# Patient Record
Sex: Female | Born: 1935 | Race: White | Hispanic: No | Smoking: Former smoker
Health system: Southern US, Community
[De-identification: ages and names within clinical notes are randomized; demographics above are authoritative.]

## PROBLEM LIST (undated history)

## (undated) DIAGNOSIS — I4891 Unspecified atrial fibrillation: Secondary | ICD-10-CM

## (undated) DIAGNOSIS — N3946 Mixed incontinence: Secondary | ICD-10-CM

## (undated) DIAGNOSIS — R55 Syncope and collapse: Secondary | ICD-10-CM

## (undated) DIAGNOSIS — I10 Essential (primary) hypertension: Secondary | ICD-10-CM

## (undated) DIAGNOSIS — E876 Hypokalemia: Secondary | ICD-10-CM

## (undated) DIAGNOSIS — F418 Other specified anxiety disorders: Secondary | ICD-10-CM

## (undated) DIAGNOSIS — R911 Solitary pulmonary nodule: Secondary | ICD-10-CM

## (undated) DIAGNOSIS — F0390 Unspecified dementia without behavioral disturbance: Secondary | ICD-10-CM

## (undated) DIAGNOSIS — K632 Fistula of intestine: Secondary | ICD-10-CM

## (undated) DIAGNOSIS — N6310 Unspecified lump in the right breast, unspecified quadrant: Secondary | ICD-10-CM

## (undated) DIAGNOSIS — E538 Deficiency of other specified B group vitamins: Secondary | ICD-10-CM

## (undated) DIAGNOSIS — K219 Gastro-esophageal reflux disease without esophagitis: Secondary | ICD-10-CM

## (undated) DIAGNOSIS — K589 Irritable bowel syndrome without diarrhea: Secondary | ICD-10-CM

## (undated) DIAGNOSIS — M1990 Unspecified osteoarthritis, unspecified site: Secondary | ICD-10-CM

## (undated) DIAGNOSIS — H3530 Unspecified macular degeneration: Secondary | ICD-10-CM

## (undated) HISTORY — PX: INSERTION OF MESH: SHX5868

## (undated) HISTORY — DX: Essential (primary) hypertension: I10

## (undated) HISTORY — DX: Mixed incontinence: N39.46

## (undated) HISTORY — DX: Solitary pulmonary nodule: R91.1

## (undated) HISTORY — DX: Gastro-esophageal reflux disease without esophagitis: K21.9

## (undated) HISTORY — DX: Unspecified macular degeneration: H35.30

## (undated) HISTORY — PX: CARDIAC CATHETERIZATION: SHX172

## (undated) HISTORY — PX: ILEOSTOMY CLOSURE: SHX1784

## (undated) HISTORY — DX: Unspecified osteoarthritis, unspecified site: M19.90

## (undated) HISTORY — DX: Fistula of intestine: K63.2

## (undated) HISTORY — DX: Other specified anxiety disorders: F41.8

## (undated) HISTORY — DX: Unspecified dementia, unspecified severity, without behavioral disturbance, psychotic disturbance, mood disturbance, and anxiety: F03.90

## (undated) HISTORY — DX: Unspecified atrial fibrillation: I48.91

## (undated) HISTORY — DX: Deficiency of other specified B group vitamins: E53.8

## (undated) HISTORY — DX: Syncope and collapse: R55

## (undated) HISTORY — DX: Hypokalemia: E87.6

## (undated) HISTORY — PX: ABDOMINAL HYSTERECTOMY: SHX81

## (undated) HISTORY — PX: OTHER SURGICAL HISTORY: SHX169

## (undated) HISTORY — PX: COLOSTOMY CLOSURE: SHX1381

## (undated) HISTORY — DX: Irritable bowel syndrome, unspecified: K58.9

## (undated) HISTORY — DX: Unspecified lump in the right breast, unspecified quadrant: N63.10

---

## 1988-09-11 HISTORY — PX: OTHER SURGICAL HISTORY: SHX169

## 2000-12-27 ENCOUNTER — Encounter: Payer: Self-pay | Admitting: *Deleted

## 2000-12-27 ENCOUNTER — Inpatient Hospital Stay (HOSPITAL_COMMUNITY): Admission: EM | Admit: 2000-12-27 | Discharge: 2000-12-30 | Payer: Self-pay | Admitting: *Deleted

## 2000-12-28 ENCOUNTER — Encounter (INDEPENDENT_AMBULATORY_CARE_PROVIDER_SITE_OTHER): Payer: Self-pay | Admitting: Internal Medicine

## 2001-01-01 ENCOUNTER — Encounter (INDEPENDENT_AMBULATORY_CARE_PROVIDER_SITE_OTHER): Payer: Self-pay | Admitting: Internal Medicine

## 2001-01-01 ENCOUNTER — Ambulatory Visit (HOSPITAL_COMMUNITY): Admission: RE | Admit: 2001-01-01 | Discharge: 2001-01-01 | Payer: Self-pay | Admitting: Internal Medicine

## 2001-06-06 ENCOUNTER — Ambulatory Visit (HOSPITAL_COMMUNITY): Admission: RE | Admit: 2001-06-06 | Discharge: 2001-06-06 | Payer: Self-pay | Admitting: Internal Medicine

## 2001-06-06 ENCOUNTER — Encounter (INDEPENDENT_AMBULATORY_CARE_PROVIDER_SITE_OTHER): Payer: Self-pay | Admitting: Internal Medicine

## 2001-08-24 ENCOUNTER — Emergency Department (HOSPITAL_COMMUNITY): Admission: EM | Admit: 2001-08-24 | Discharge: 2001-08-24 | Payer: Self-pay | Admitting: *Deleted

## 2001-08-24 ENCOUNTER — Encounter: Payer: Self-pay | Admitting: *Deleted

## 2002-05-16 ENCOUNTER — Ambulatory Visit (HOSPITAL_COMMUNITY): Admission: RE | Admit: 2002-05-16 | Discharge: 2002-05-16 | Payer: Self-pay | Admitting: General Surgery

## 2002-06-12 ENCOUNTER — Encounter: Payer: Self-pay | Admitting: Ophthalmology

## 2002-06-12 ENCOUNTER — Ambulatory Visit (HOSPITAL_COMMUNITY): Admission: RE | Admit: 2002-06-12 | Discharge: 2002-06-12 | Payer: Self-pay | Admitting: Ophthalmology

## 2002-07-17 ENCOUNTER — Encounter (INDEPENDENT_AMBULATORY_CARE_PROVIDER_SITE_OTHER): Payer: Self-pay | Admitting: Internal Medicine

## 2002-07-17 ENCOUNTER — Ambulatory Visit (HOSPITAL_COMMUNITY): Admission: RE | Admit: 2002-07-17 | Discharge: 2002-07-17 | Payer: Self-pay | Admitting: Internal Medicine

## 2002-09-15 ENCOUNTER — Ambulatory Visit (HOSPITAL_COMMUNITY): Admission: RE | Admit: 2002-09-15 | Discharge: 2002-09-15 | Payer: Self-pay | Admitting: Ophthalmology

## 2002-10-14 ENCOUNTER — Inpatient Hospital Stay (HOSPITAL_COMMUNITY): Admission: RE | Admit: 2002-10-14 | Discharge: 2002-10-20 | Payer: Self-pay | Admitting: General Surgery

## 2002-10-16 ENCOUNTER — Encounter: Payer: Self-pay | Admitting: *Deleted

## 2002-11-24 ENCOUNTER — Ambulatory Visit (HOSPITAL_COMMUNITY): Admission: RE | Admit: 2002-11-24 | Discharge: 2002-11-24 | Payer: Self-pay | Admitting: Internal Medicine

## 2002-11-24 ENCOUNTER — Encounter (INDEPENDENT_AMBULATORY_CARE_PROVIDER_SITE_OTHER): Payer: Self-pay | Admitting: Internal Medicine

## 2003-01-05 ENCOUNTER — Ambulatory Visit (HOSPITAL_COMMUNITY): Admission: RE | Admit: 2003-01-05 | Discharge: 2003-01-05 | Payer: Self-pay | Admitting: General Surgery

## 2003-01-05 ENCOUNTER — Encounter: Payer: Self-pay | Admitting: General Surgery

## 2003-11-06 ENCOUNTER — Inpatient Hospital Stay (HOSPITAL_COMMUNITY): Admission: RE | Admit: 2003-11-06 | Discharge: 2003-11-09 | Payer: Self-pay | Admitting: General Surgery

## 2004-01-19 ENCOUNTER — Ambulatory Visit (HOSPITAL_COMMUNITY): Admission: RE | Admit: 2004-01-19 | Discharge: 2004-01-19 | Payer: Self-pay | Admitting: Oncology

## 2004-02-19 ENCOUNTER — Ambulatory Visit (HOSPITAL_COMMUNITY): Admission: RE | Admit: 2004-02-19 | Discharge: 2004-02-19 | Payer: Self-pay | Admitting: Family Medicine

## 2004-05-31 ENCOUNTER — Ambulatory Visit (HOSPITAL_COMMUNITY): Admission: RE | Admit: 2004-05-31 | Discharge: 2004-05-31 | Payer: Self-pay | Admitting: General Surgery

## 2004-06-21 ENCOUNTER — Ambulatory Visit (HOSPITAL_COMMUNITY): Admission: RE | Admit: 2004-06-21 | Discharge: 2004-06-21 | Payer: Self-pay | Admitting: General Surgery

## 2005-05-24 ENCOUNTER — Ambulatory Visit: Payer: Self-pay | Admitting: Internal Medicine

## 2005-06-07 ENCOUNTER — Ambulatory Visit: Payer: Self-pay | Admitting: Internal Medicine

## 2005-06-23 ENCOUNTER — Ambulatory Visit: Payer: Self-pay | Admitting: Internal Medicine

## 2005-10-20 ENCOUNTER — Ambulatory Visit: Payer: Self-pay | Admitting: Urgent Care

## 2006-03-22 ENCOUNTER — Ambulatory Visit (HOSPITAL_COMMUNITY): Admission: RE | Admit: 2006-03-22 | Discharge: 2006-03-22 | Payer: Self-pay | Admitting: General Surgery

## 2006-05-17 ENCOUNTER — Ambulatory Visit: Payer: Self-pay | Admitting: Internal Medicine

## 2006-11-01 ENCOUNTER — Ambulatory Visit: Payer: Self-pay | Admitting: Internal Medicine

## 2006-11-05 ENCOUNTER — Ambulatory Visit (HOSPITAL_COMMUNITY): Admission: RE | Admit: 2006-11-05 | Discharge: 2006-11-05 | Payer: Self-pay | Admitting: Internal Medicine

## 2006-11-13 ENCOUNTER — Encounter: Admission: RE | Admit: 2006-11-13 | Discharge: 2006-11-13 | Payer: Self-pay | Admitting: Internal Medicine

## 2006-11-13 ENCOUNTER — Encounter (INDEPENDENT_AMBULATORY_CARE_PROVIDER_SITE_OTHER): Payer: Self-pay | Admitting: Specialist

## 2006-11-23 ENCOUNTER — Ambulatory Visit: Payer: Self-pay | Admitting: Internal Medicine

## 2006-11-23 ENCOUNTER — Ambulatory Visit (HOSPITAL_COMMUNITY): Admission: RE | Admit: 2006-11-23 | Discharge: 2006-11-23 | Payer: Self-pay | Admitting: Internal Medicine

## 2006-11-28 ENCOUNTER — Ambulatory Visit: Payer: Self-pay | Admitting: Internal Medicine

## 2006-11-30 ENCOUNTER — Encounter: Admission: RE | Admit: 2006-11-30 | Discharge: 2006-11-30 | Payer: Self-pay | Admitting: Internal Medicine

## 2007-03-14 ENCOUNTER — Ambulatory Visit: Payer: Self-pay | Admitting: Family Medicine

## 2007-03-14 DIAGNOSIS — J309 Allergic rhinitis, unspecified: Secondary | ICD-10-CM | POA: Insufficient documentation

## 2007-03-14 DIAGNOSIS — K219 Gastro-esophageal reflux disease without esophagitis: Secondary | ICD-10-CM

## 2007-03-14 DIAGNOSIS — K589 Irritable bowel syndrome without diarrhea: Secondary | ICD-10-CM

## 2007-03-14 DIAGNOSIS — H269 Unspecified cataract: Secondary | ICD-10-CM

## 2007-03-14 DIAGNOSIS — M129 Arthropathy, unspecified: Secondary | ICD-10-CM | POA: Insufficient documentation

## 2007-03-14 DIAGNOSIS — J438 Other emphysema: Secondary | ICD-10-CM | POA: Insufficient documentation

## 2007-03-14 DIAGNOSIS — H3530 Unspecified macular degeneration: Secondary | ICD-10-CM | POA: Insufficient documentation

## 2007-03-18 ENCOUNTER — Encounter (INDEPENDENT_AMBULATORY_CARE_PROVIDER_SITE_OTHER): Payer: Self-pay | Admitting: Family Medicine

## 2007-03-18 ENCOUNTER — Ambulatory Visit (HOSPITAL_COMMUNITY): Admission: RE | Admit: 2007-03-18 | Discharge: 2007-03-18 | Payer: Self-pay | Admitting: Family Medicine

## 2007-03-19 ENCOUNTER — Telehealth (INDEPENDENT_AMBULATORY_CARE_PROVIDER_SITE_OTHER): Payer: Self-pay | Admitting: *Deleted

## 2007-03-19 LAB — CONVERTED CEMR LAB
Albumin: 3.7 g/dL (ref 3.5–5.2)
Alkaline Phosphatase: 94 units/L (ref 39–117)
BUN: 9 mg/dL (ref 6–23)
Calcium: 8.8 mg/dL (ref 8.4–10.5)
Chloride: 106 meq/L (ref 96–112)
Eosinophils Absolute: 0.2 10*3/uL (ref 0.0–0.7)
Glucose, Bld: 86 mg/dL (ref 70–99)
HDL: 52 mg/dL (ref >39)
Hemoglobin: 12.8 g/dL (ref 12.0–15.0)
Lymphs Abs: 2 10*3/uL (ref 0.7–3.3)
MCV: 88.9 fL (ref 78.0–100.0)
Monocytes Absolute: 0.4 10*3/uL (ref 0.2–0.7)
Monocytes Relative: 6 % (ref 3–11)
Neutrophils Relative %: 57 % (ref 43–77)
Potassium: 4.2 meq/L (ref 3.5–5.3)
RBC: 4.76 M/uL (ref 3.87–5.11)
Triglycerides: 108 mg/dL (ref <150)
WBC: 6 10*3/uL (ref 4.0–10.5)

## 2007-03-22 ENCOUNTER — Ambulatory Visit (HOSPITAL_COMMUNITY): Admission: RE | Admit: 2007-03-22 | Discharge: 2007-03-22 | Payer: Self-pay | Admitting: Family Medicine

## 2007-03-25 ENCOUNTER — Telehealth (INDEPENDENT_AMBULATORY_CARE_PROVIDER_SITE_OTHER): Payer: Self-pay | Admitting: *Deleted

## 2007-03-26 ENCOUNTER — Ambulatory Visit: Payer: Self-pay | Admitting: Family Medicine

## 2007-03-26 LAB — CONVERTED CEMR LAB: HDL goal, serum: 40 mg/dL

## 2007-03-27 ENCOUNTER — Encounter (INDEPENDENT_AMBULATORY_CARE_PROVIDER_SITE_OTHER): Payer: Self-pay | Admitting: Family Medicine

## 2007-03-27 ENCOUNTER — Telehealth (INDEPENDENT_AMBULATORY_CARE_PROVIDER_SITE_OTHER): Payer: Self-pay | Admitting: Family Medicine

## 2007-03-29 ENCOUNTER — Ambulatory Visit (HOSPITAL_COMMUNITY): Admission: RE | Admit: 2007-03-29 | Discharge: 2007-03-29 | Payer: Self-pay | Admitting: Family Medicine

## 2007-04-01 ENCOUNTER — Telehealth (INDEPENDENT_AMBULATORY_CARE_PROVIDER_SITE_OTHER): Payer: Self-pay | Admitting: Family Medicine

## 2007-04-02 ENCOUNTER — Ambulatory Visit: Payer: Self-pay | Admitting: Family Medicine

## 2007-04-05 ENCOUNTER — Telehealth (INDEPENDENT_AMBULATORY_CARE_PROVIDER_SITE_OTHER): Payer: Self-pay | Admitting: Family Medicine

## 2007-04-10 ENCOUNTER — Encounter (INDEPENDENT_AMBULATORY_CARE_PROVIDER_SITE_OTHER): Payer: Self-pay | Admitting: Family Medicine

## 2007-04-16 ENCOUNTER — Ambulatory Visit: Payer: Self-pay | Admitting: Family Medicine

## 2007-04-17 ENCOUNTER — Telehealth (INDEPENDENT_AMBULATORY_CARE_PROVIDER_SITE_OTHER): Payer: Self-pay | Admitting: *Deleted

## 2007-04-22 ENCOUNTER — Encounter (INDEPENDENT_AMBULATORY_CARE_PROVIDER_SITE_OTHER): Payer: Self-pay | Admitting: Family Medicine

## 2007-04-23 ENCOUNTER — Encounter (INDEPENDENT_AMBULATORY_CARE_PROVIDER_SITE_OTHER): Payer: Self-pay | Admitting: Family Medicine

## 2007-04-25 ENCOUNTER — Encounter (INDEPENDENT_AMBULATORY_CARE_PROVIDER_SITE_OTHER): Payer: Self-pay | Admitting: Family Medicine

## 2007-05-01 ENCOUNTER — Encounter (INDEPENDENT_AMBULATORY_CARE_PROVIDER_SITE_OTHER): Payer: Self-pay | Admitting: Family Medicine

## 2007-05-01 ENCOUNTER — Ambulatory Visit (HOSPITAL_COMMUNITY): Admission: RE | Admit: 2007-05-01 | Discharge: 2007-05-01 | Payer: Self-pay | Admitting: *Deleted

## 2007-05-06 ENCOUNTER — Telehealth (INDEPENDENT_AMBULATORY_CARE_PROVIDER_SITE_OTHER): Payer: Self-pay | Admitting: Family Medicine

## 2007-05-06 ENCOUNTER — Ambulatory Visit: Payer: Self-pay | Admitting: Family Medicine

## 2007-05-06 ENCOUNTER — Encounter: Payer: Self-pay | Admitting: Family Medicine

## 2007-05-06 ENCOUNTER — Encounter (INDEPENDENT_AMBULATORY_CARE_PROVIDER_SITE_OTHER): Payer: Self-pay | Admitting: Family Medicine

## 2007-05-06 DIAGNOSIS — I10 Essential (primary) hypertension: Secondary | ICD-10-CM | POA: Insufficient documentation

## 2007-05-14 ENCOUNTER — Observation Stay (HOSPITAL_COMMUNITY): Admission: EM | Admit: 2007-05-14 | Discharge: 2007-05-17 | Payer: Self-pay | Admitting: Emergency Medicine

## 2007-05-14 ENCOUNTER — Ambulatory Visit: Payer: Self-pay | Admitting: Internal Medicine

## 2007-05-16 ENCOUNTER — Encounter (INDEPENDENT_AMBULATORY_CARE_PROVIDER_SITE_OTHER): Payer: Self-pay | Admitting: Family Medicine

## 2007-05-21 ENCOUNTER — Emergency Department (HOSPITAL_COMMUNITY): Admission: EM | Admit: 2007-05-21 | Discharge: 2007-05-21 | Payer: Self-pay | Admitting: Emergency Medicine

## 2007-05-22 ENCOUNTER — Inpatient Hospital Stay (HOSPITAL_COMMUNITY): Admission: EM | Admit: 2007-05-22 | Discharge: 2007-05-24 | Payer: Self-pay | Admitting: Emergency Medicine

## 2007-05-22 ENCOUNTER — Ambulatory Visit: Payer: Self-pay | Admitting: Internal Medicine

## 2007-05-24 ENCOUNTER — Encounter (INDEPENDENT_AMBULATORY_CARE_PROVIDER_SITE_OTHER): Payer: Self-pay | Admitting: Family Medicine

## 2007-05-29 ENCOUNTER — Ambulatory Visit: Payer: Self-pay | Admitting: Family Medicine

## 2007-05-29 DIAGNOSIS — I4891 Unspecified atrial fibrillation: Secondary | ICD-10-CM

## 2007-05-30 ENCOUNTER — Encounter (INDEPENDENT_AMBULATORY_CARE_PROVIDER_SITE_OTHER): Payer: Self-pay | Admitting: Family Medicine

## 2007-05-30 ENCOUNTER — Ambulatory Visit: Payer: Self-pay | Admitting: Cardiovascular Disease

## 2007-05-30 ENCOUNTER — Ambulatory Visit: Payer: Self-pay | Admitting: Cardiology

## 2007-05-30 ENCOUNTER — Inpatient Hospital Stay (HOSPITAL_COMMUNITY): Admission: EM | Admit: 2007-05-30 | Discharge: 2007-05-31 | Payer: Self-pay | Admitting: Emergency Medicine

## 2007-05-30 LAB — CONVERTED CEMR LAB
CO2: 24 meq/L (ref 19–32)
Chloride: 101 meq/L (ref 96–112)
Sodium: 135 meq/L (ref 135–145)

## 2007-06-05 ENCOUNTER — Ambulatory Visit: Payer: Self-pay | Admitting: Internal Medicine

## 2007-06-05 ENCOUNTER — Ambulatory Visit: Payer: Self-pay

## 2007-06-13 ENCOUNTER — Encounter (INDEPENDENT_AMBULATORY_CARE_PROVIDER_SITE_OTHER): Payer: Self-pay | Admitting: Family Medicine

## 2007-06-14 ENCOUNTER — Ambulatory Visit: Payer: Self-pay | Admitting: Family Medicine

## 2007-06-14 DIAGNOSIS — F341 Dysthymic disorder: Secondary | ICD-10-CM

## 2007-06-17 ENCOUNTER — Telehealth (INDEPENDENT_AMBULATORY_CARE_PROVIDER_SITE_OTHER): Payer: Self-pay | Admitting: *Deleted

## 2007-06-17 ENCOUNTER — Encounter (INDEPENDENT_AMBULATORY_CARE_PROVIDER_SITE_OTHER): Payer: Self-pay | Admitting: Family Medicine

## 2007-06-18 ENCOUNTER — Telehealth (INDEPENDENT_AMBULATORY_CARE_PROVIDER_SITE_OTHER): Payer: Self-pay | Admitting: Family Medicine

## 2007-06-18 ENCOUNTER — Encounter (INDEPENDENT_AMBULATORY_CARE_PROVIDER_SITE_OTHER): Payer: Self-pay | Admitting: Family Medicine

## 2007-06-18 ENCOUNTER — Telehealth (INDEPENDENT_AMBULATORY_CARE_PROVIDER_SITE_OTHER): Payer: Self-pay | Admitting: *Deleted

## 2007-06-18 DIAGNOSIS — F068 Other specified mental disorders due to known physiological condition: Secondary | ICD-10-CM | POA: Insufficient documentation

## 2007-06-19 ENCOUNTER — Encounter (INDEPENDENT_AMBULATORY_CARE_PROVIDER_SITE_OTHER): Payer: Self-pay | Admitting: Family Medicine

## 2007-06-19 ENCOUNTER — Ambulatory Visit (HOSPITAL_COMMUNITY): Admission: RE | Admit: 2007-06-19 | Discharge: 2007-06-19 | Payer: Self-pay | Admitting: Family Medicine

## 2007-06-20 ENCOUNTER — Telehealth (INDEPENDENT_AMBULATORY_CARE_PROVIDER_SITE_OTHER): Payer: Self-pay | Admitting: *Deleted

## 2007-06-20 LAB — CONVERTED CEMR LAB: Folate: 6.4 ng/mL

## 2007-06-24 ENCOUNTER — Encounter (INDEPENDENT_AMBULATORY_CARE_PROVIDER_SITE_OTHER): Payer: Self-pay | Admitting: Family Medicine

## 2007-06-28 ENCOUNTER — Encounter (INDEPENDENT_AMBULATORY_CARE_PROVIDER_SITE_OTHER): Payer: Self-pay | Admitting: Family Medicine

## 2007-07-01 ENCOUNTER — Encounter (INDEPENDENT_AMBULATORY_CARE_PROVIDER_SITE_OTHER): Payer: Self-pay | Admitting: Family Medicine

## 2007-07-05 ENCOUNTER — Ambulatory Visit: Payer: Self-pay | Admitting: Family Medicine

## 2007-07-08 ENCOUNTER — Ambulatory Visit: Payer: Self-pay | Admitting: Family Medicine

## 2007-07-08 DIAGNOSIS — E538 Deficiency of other specified B group vitamins: Secondary | ICD-10-CM

## 2007-07-09 ENCOUNTER — Telehealth (INDEPENDENT_AMBULATORY_CARE_PROVIDER_SITE_OTHER): Payer: Self-pay | Admitting: *Deleted

## 2007-07-16 ENCOUNTER — Encounter (INDEPENDENT_AMBULATORY_CARE_PROVIDER_SITE_OTHER): Payer: Self-pay | Admitting: Family Medicine

## 2007-07-19 ENCOUNTER — Telehealth (INDEPENDENT_AMBULATORY_CARE_PROVIDER_SITE_OTHER): Payer: Self-pay | Admitting: *Deleted

## 2007-08-05 ENCOUNTER — Ambulatory Visit: Payer: Self-pay | Admitting: Family Medicine

## 2007-08-05 DIAGNOSIS — N3946 Mixed incontinence: Secondary | ICD-10-CM

## 2007-08-21 ENCOUNTER — Ambulatory Visit: Payer: Self-pay | Admitting: Internal Medicine

## 2007-08-21 ENCOUNTER — Ambulatory Visit: Payer: Self-pay | Admitting: Cardiovascular Disease

## 2007-08-25 ENCOUNTER — Encounter (INDEPENDENT_AMBULATORY_CARE_PROVIDER_SITE_OTHER): Payer: Self-pay | Admitting: Family Medicine

## 2007-08-25 ENCOUNTER — Ambulatory Visit: Payer: Self-pay | Admitting: Cardiovascular Disease

## 2007-08-25 ENCOUNTER — Ambulatory Visit: Payer: Self-pay | Admitting: Internal Medicine

## 2007-08-25 ENCOUNTER — Inpatient Hospital Stay (HOSPITAL_COMMUNITY): Admission: EM | Admit: 2007-08-25 | Discharge: 2007-08-28 | Payer: Self-pay | Admitting: Emergency Medicine

## 2007-08-26 ENCOUNTER — Encounter: Payer: Self-pay | Admitting: Internal Medicine

## 2007-08-28 ENCOUNTER — Encounter (INDEPENDENT_AMBULATORY_CARE_PROVIDER_SITE_OTHER): Payer: Self-pay | Admitting: Family Medicine

## 2007-09-02 ENCOUNTER — Telehealth (INDEPENDENT_AMBULATORY_CARE_PROVIDER_SITE_OTHER): Payer: Self-pay | Admitting: *Deleted

## 2007-09-03 ENCOUNTER — Ambulatory Visit: Payer: Self-pay | Admitting: Family Medicine

## 2007-09-03 LAB — CONVERTED CEMR LAB
Glucose, Urine, Semiquant: NEGATIVE
Ketones, urine, test strip: NEGATIVE
Urobilinogen, UA: 0.2
pH: 5.5

## 2007-09-04 ENCOUNTER — Telehealth (INDEPENDENT_AMBULATORY_CARE_PROVIDER_SITE_OTHER): Payer: Self-pay | Admitting: *Deleted

## 2007-09-04 ENCOUNTER — Encounter (INDEPENDENT_AMBULATORY_CARE_PROVIDER_SITE_OTHER): Payer: Self-pay | Admitting: Family Medicine

## 2007-10-08 ENCOUNTER — Ambulatory Visit: Payer: Self-pay | Admitting: Family Medicine

## 2007-10-09 ENCOUNTER — Telehealth (INDEPENDENT_AMBULATORY_CARE_PROVIDER_SITE_OTHER): Payer: Self-pay | Admitting: *Deleted

## 2007-11-06 ENCOUNTER — Encounter (INDEPENDENT_AMBULATORY_CARE_PROVIDER_SITE_OTHER): Payer: Self-pay | Admitting: Family Medicine

## 2007-11-26 ENCOUNTER — Ambulatory Visit: Payer: Self-pay | Admitting: Internal Medicine

## 2007-11-28 ENCOUNTER — Encounter (INDEPENDENT_AMBULATORY_CARE_PROVIDER_SITE_OTHER): Payer: Self-pay | Admitting: Family Medicine

## 2007-12-02 ENCOUNTER — Encounter (INDEPENDENT_AMBULATORY_CARE_PROVIDER_SITE_OTHER): Payer: Self-pay | Admitting: Family Medicine

## 2007-12-10 ENCOUNTER — Ambulatory Visit: Payer: Self-pay | Admitting: Internal Medicine

## 2007-12-10 ENCOUNTER — Encounter (INDEPENDENT_AMBULATORY_CARE_PROVIDER_SITE_OTHER): Payer: Self-pay | Admitting: Family Medicine

## 2007-12-24 ENCOUNTER — Ambulatory Visit: Payer: Self-pay | Admitting: Family Medicine

## 2007-12-24 DIAGNOSIS — E079 Disorder of thyroid, unspecified: Secondary | ICD-10-CM | POA: Insufficient documentation

## 2007-12-24 LAB — CONVERTED CEMR LAB
Cholesterol: 161 mg/dL
LDL Cholesterol: 89 mg/dL

## 2007-12-27 ENCOUNTER — Encounter (INDEPENDENT_AMBULATORY_CARE_PROVIDER_SITE_OTHER): Payer: Self-pay | Admitting: Family Medicine

## 2007-12-27 LAB — CONVERTED CEMR LAB: TSH: 5.94 microintl units/mL — ABNORMAL HIGH (ref 0.350–5.50)

## 2007-12-30 ENCOUNTER — Telehealth (INDEPENDENT_AMBULATORY_CARE_PROVIDER_SITE_OTHER): Payer: Self-pay | Admitting: *Deleted

## 2008-01-13 ENCOUNTER — Ambulatory Visit: Payer: Self-pay | Admitting: Internal Medicine

## 2008-01-20 ENCOUNTER — Ambulatory Visit: Payer: Self-pay

## 2008-01-20 ENCOUNTER — Ambulatory Visit: Payer: Self-pay | Admitting: Family Medicine

## 2008-01-22 ENCOUNTER — Ambulatory Visit: Payer: Self-pay | Admitting: Orthopedic Surgery

## 2008-01-28 ENCOUNTER — Telehealth: Payer: Self-pay | Admitting: Orthopedic Surgery

## 2008-01-30 ENCOUNTER — Ambulatory Visit: Payer: Self-pay | Admitting: Orthopedic Surgery

## 2008-02-06 ENCOUNTER — Ambulatory Visit: Payer: Self-pay | Admitting: Orthopedic Surgery

## 2008-02-13 ENCOUNTER — Telehealth: Payer: Self-pay | Admitting: Orthopedic Surgery

## 2008-02-16 IMAGING — CR DG CHEST 2V
2 series · 2 of 2 positions shown · non-contrast
Comparison: 05/22/07.

CLINICAL DATA: Cardiac arrhythmia. AICD placement.  Postop.
 CHEST - 2 VIEW:

[w chest pa]
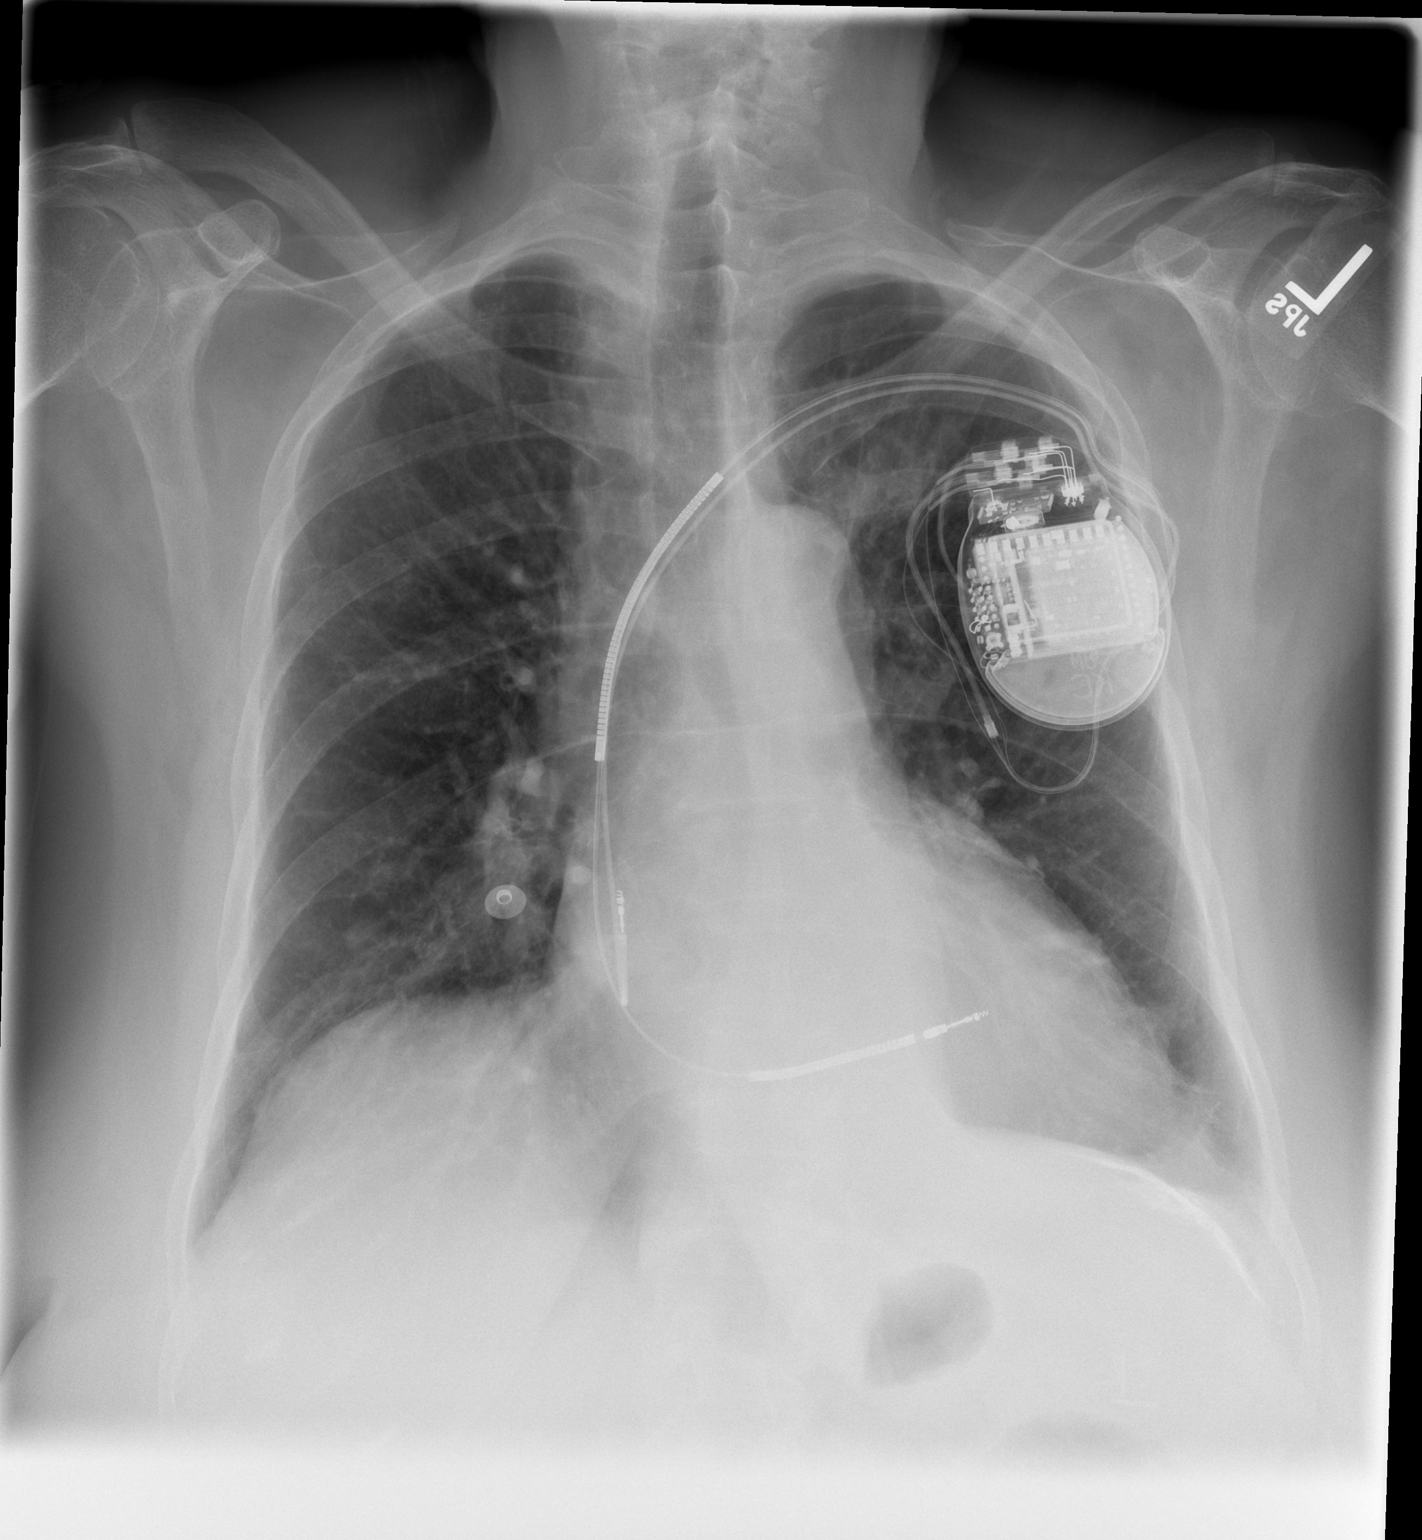

[w chest lat]
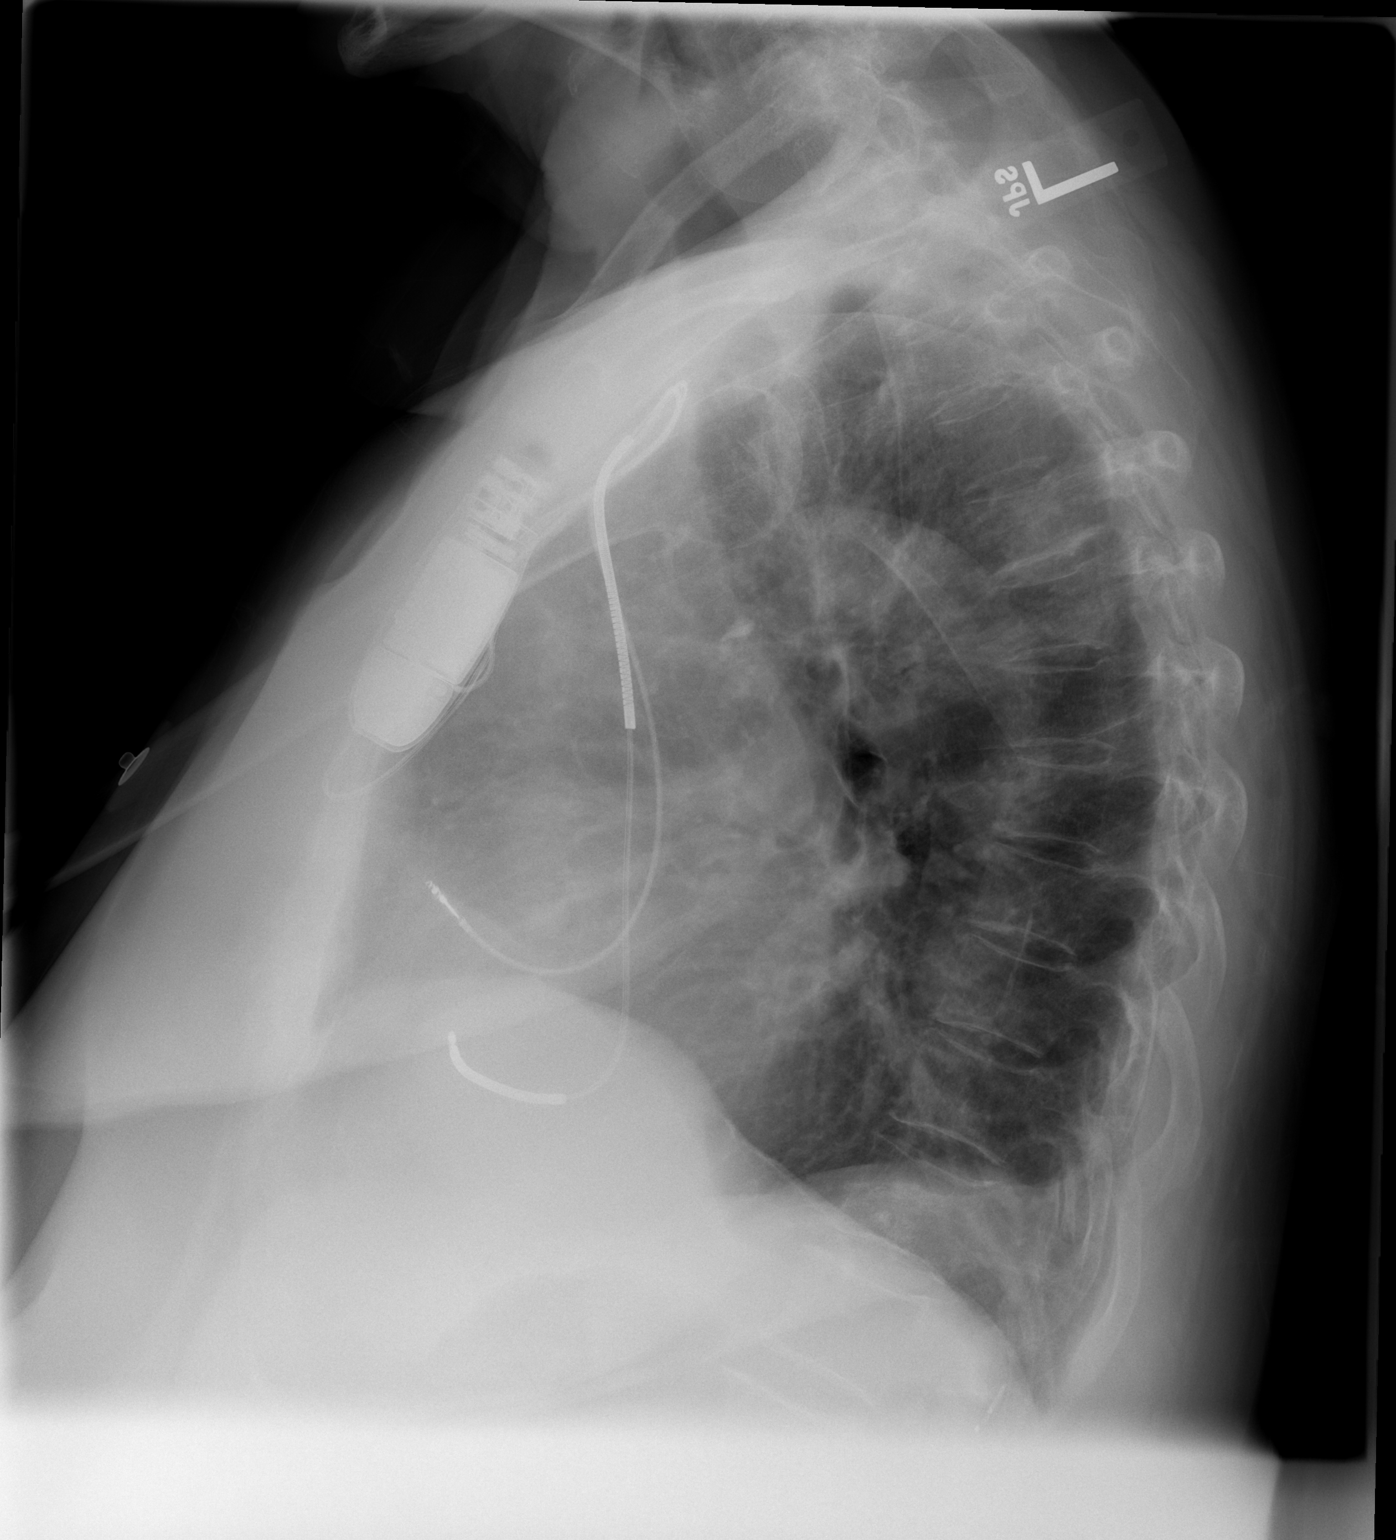

[2 of 2 positions shown; findings below may reference images not displayed]

FINDINGS: AICD is seen in appropriate position with leads in the right atrium and right ventricle.  There is no evidence of pneumothorax. 
 Both lungs are clear.  There is no evidence of pleural effusion. Cardiomegaly is stable.  Pleural calcification is again noted along the left hemidiaphragm.
IMPRESSION: 1.  AICD in appropriate position.  No evidence of pneumothorax. 
 2.  Stable cardiomegaly. No active disease.

## 2008-02-20 ENCOUNTER — Ambulatory Visit: Payer: Self-pay | Admitting: Orthopedic Surgery

## 2008-03-02 ENCOUNTER — Ambulatory Visit: Payer: Self-pay | Admitting: Internal Medicine

## 2008-03-10 ENCOUNTER — Ambulatory Visit: Payer: Self-pay | Admitting: Internal Medicine

## 2008-03-10 LAB — CONVERTED CEMR LAB
CO2: 28 meq/L (ref 19–32)
Calcium: 9.2 mg/dL (ref 8.4–10.5)
Chloride: 105 meq/L (ref 96–112)
Glucose, Bld: 115 mg/dL — ABNORMAL HIGH (ref 70–99)
Hemoglobin: 12.5 g/dL (ref 12.0–15.0)
INR: 1 (ref 0.8–1.0)
Lymphocytes Relative: 22.8 % (ref 12.0–46.0)
Monocytes Relative: 6.6 % (ref 3.0–12.0)
Neutro Abs: 4.1 10*3/uL (ref 1.4–7.7)
Neutrophils Relative %: 69.3 % (ref 43.0–77.0)
Potassium: 4.2 meq/L (ref 3.5–5.1)
Prothrombin Time: 11.8 s (ref 10.9–13.3)
RBC: 4.25 M/uL (ref 3.87–5.11)
RDW: 15.7 % — ABNORMAL HIGH (ref 11.5–14.6)
Sodium: 140 meq/L (ref 135–145)

## 2008-03-12 ENCOUNTER — Ambulatory Visit: Payer: Self-pay | Admitting: Internal Medicine

## 2008-03-12 ENCOUNTER — Inpatient Hospital Stay (HOSPITAL_COMMUNITY): Admission: RE | Admit: 2008-03-12 | Discharge: 2008-03-13 | Payer: Self-pay | Admitting: Internal Medicine

## 2008-03-14 IMAGING — CT CT HEAD W/O CM
1 series · 16 of 30 positions shown, 20 images · non-contrast
Comparison: none

CLINICAL DATA: Dementia

[Series 2: headseq 4.8 h37s · axial · 0.46mm/px · z∈[+1274,+1432]mm · 16 of 36 slices shown, 20 images]
[im 2/36  brain]
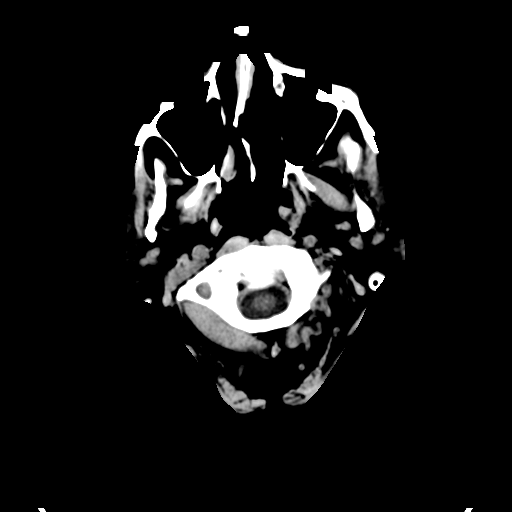
[im 2/36  bone]
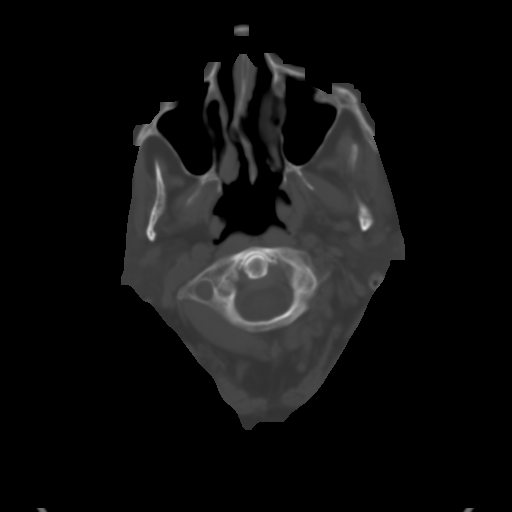
[im 4/36  brain]
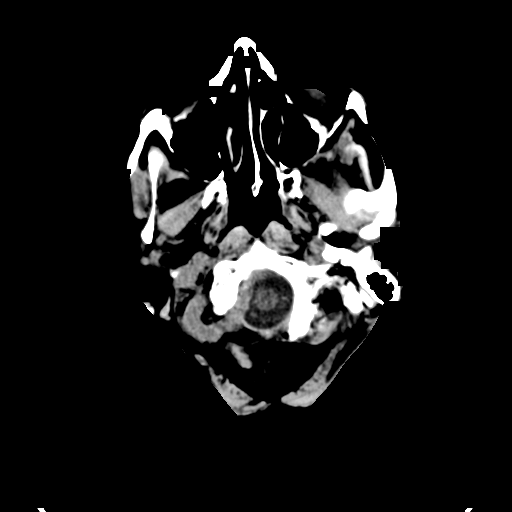
[im 7/36  brain]
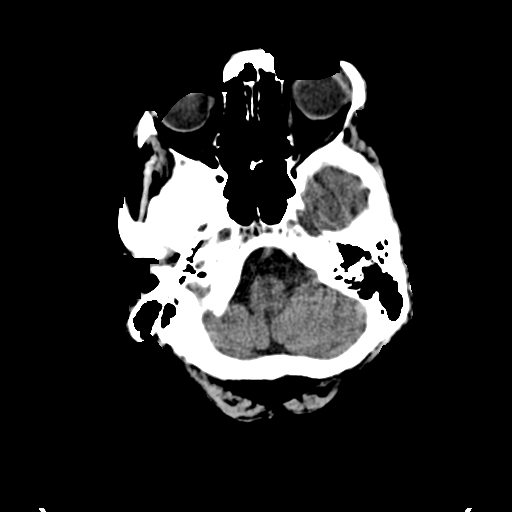
[im 9/36  brain]
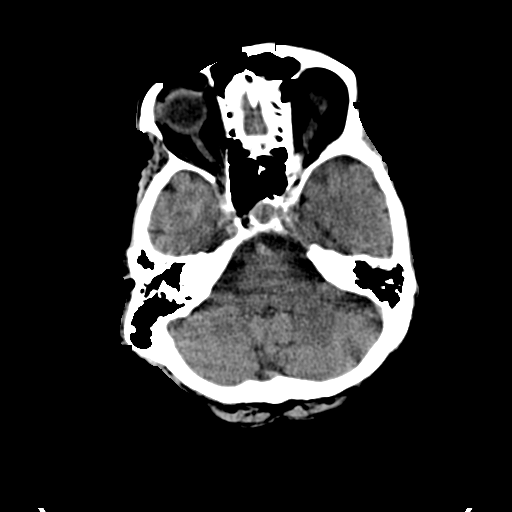
[im 10/36  brain]
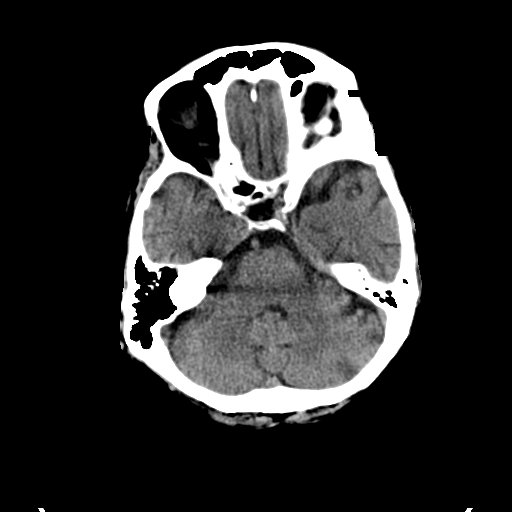
[im 10/36  bone]
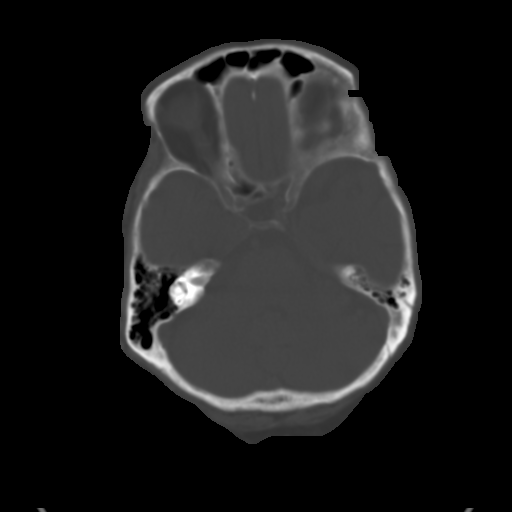
[im 13/36  brain]
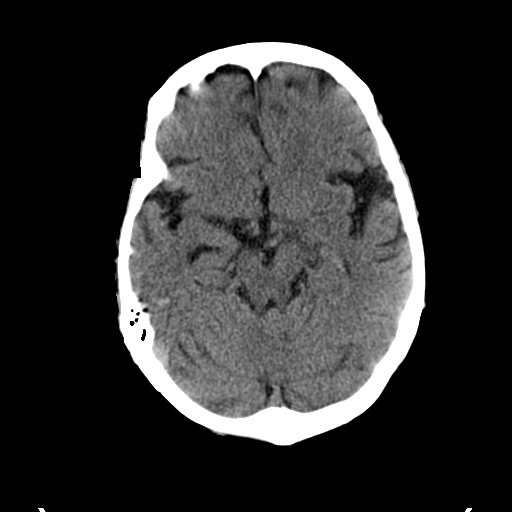
[im 15/36  brain]
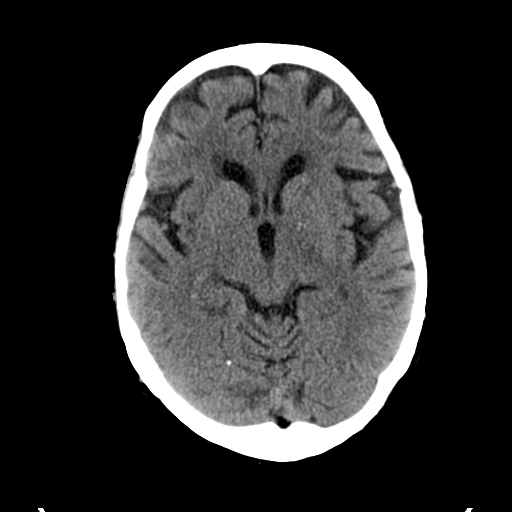
[im 17/36  brain]
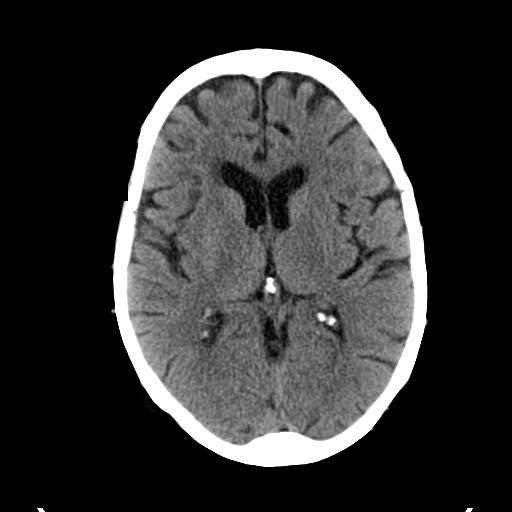
[im 19/36  brain]
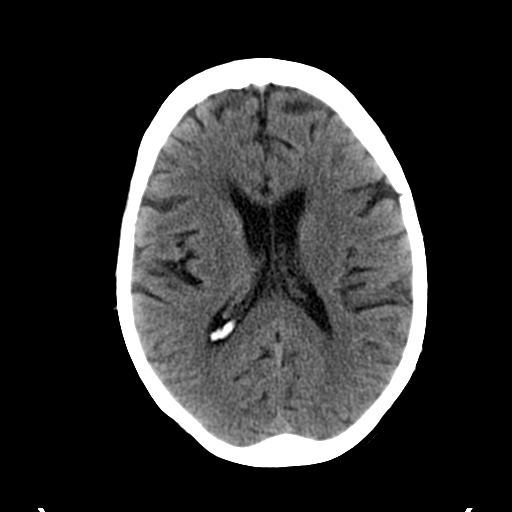
[im 19/36  bone]
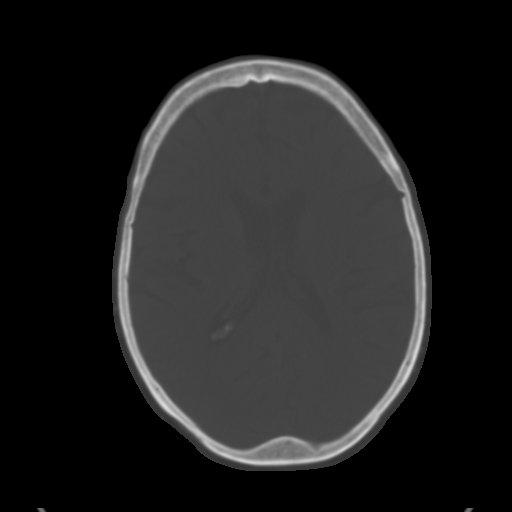
[im 21/36  brain]
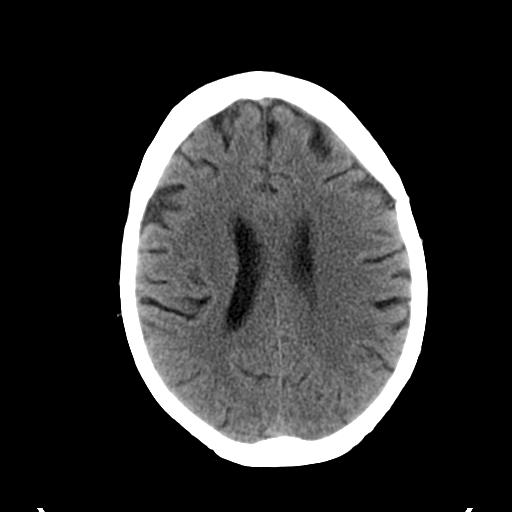
[im 23/36  brain]
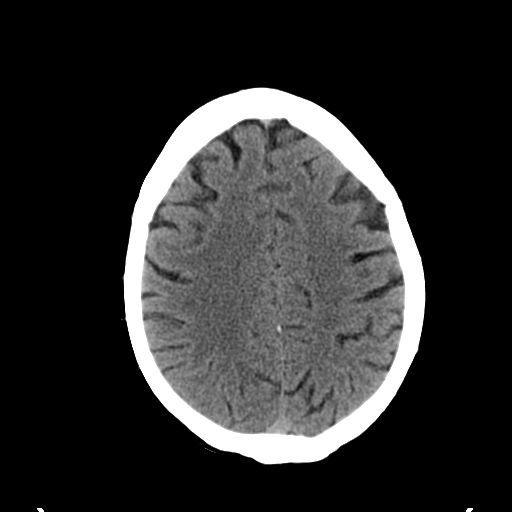
[im 26/36  brain]
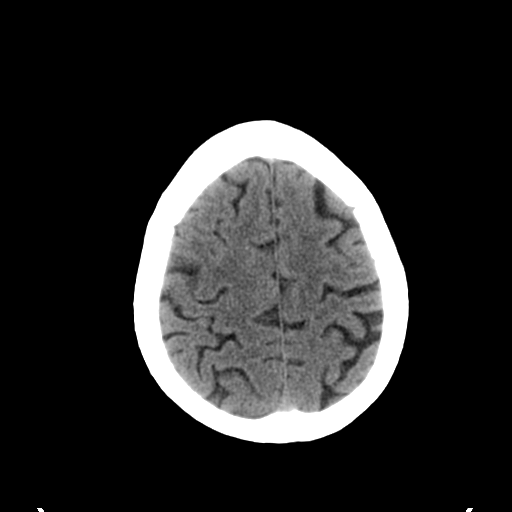
[im 27/36  brain]
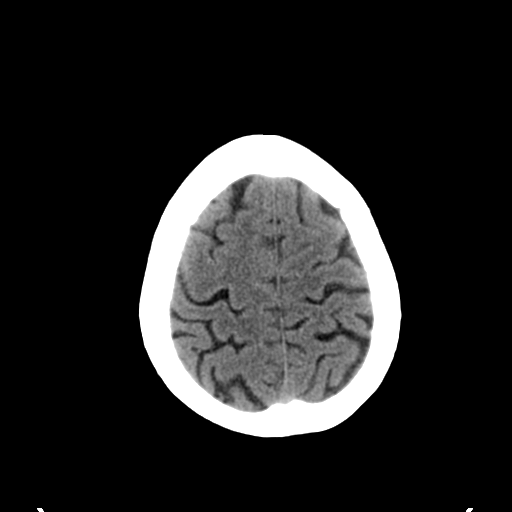
[im 27/36  bone]
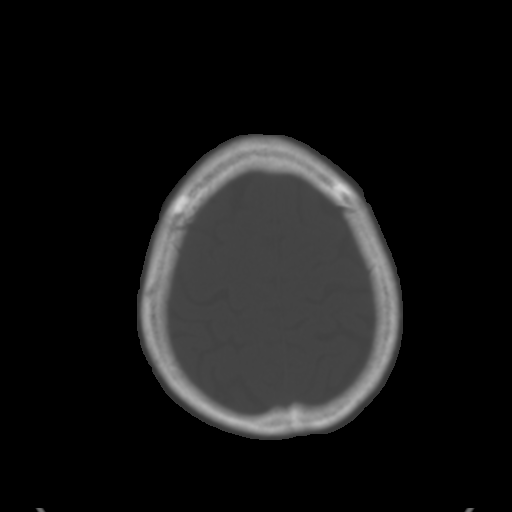
[im 29/36  brain]
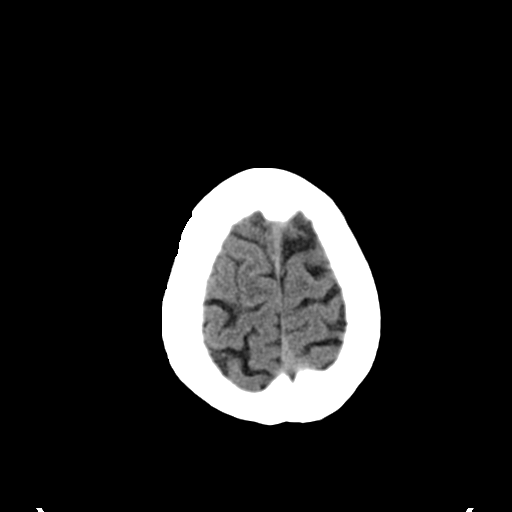
[im 32/36  brain]
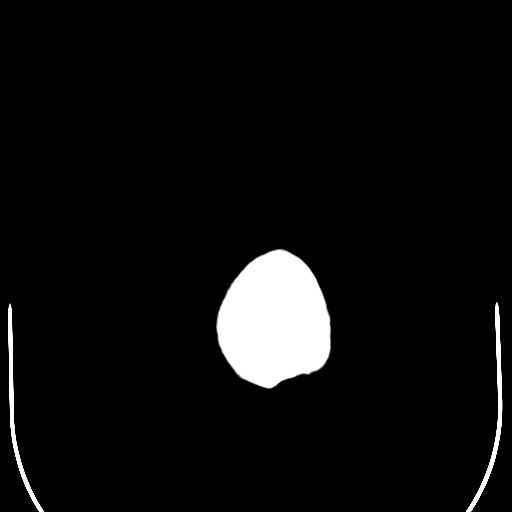
[im 34/36  brain]
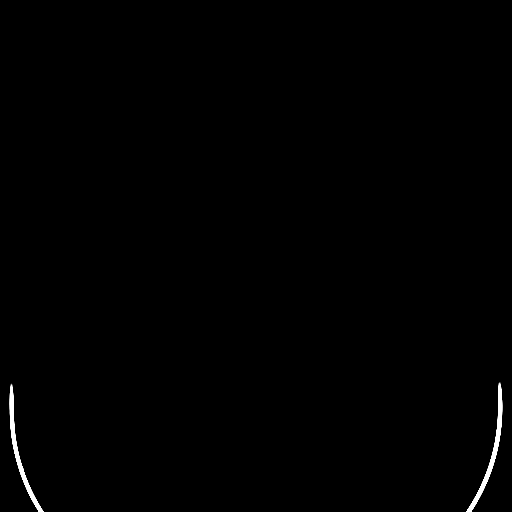

[16 of 30 positions shown; findings below may reference images not displayed]

CT head without contrast:

No previous available for comparison. Mild diffuse parenchymal atrophy.  There
is no evidence of acute intracranial hemorrhage, brain edema, mass,  mass
effect, or midline shift. Acute infarct may be inapparent on noncontrast CT.  No
other intra-axial abnormalities are seen, and the ventricles and sulci are
within normal limits in size and symmetry.   No abnormal extra-axial fluid
collections or masses are identified.  No significant calvarial abnormality.
IMPRESSION: 1. Negative non-contrast head CT.

## 2008-04-02 ENCOUNTER — Ambulatory Visit: Payer: Self-pay | Admitting: Internal Medicine

## 2008-04-09 ENCOUNTER — Ambulatory Visit: Payer: Self-pay | Admitting: Family Medicine

## 2008-04-09 DIAGNOSIS — H9319 Tinnitus, unspecified ear: Secondary | ICD-10-CM | POA: Insufficient documentation

## 2008-04-09 DIAGNOSIS — H9190 Unspecified hearing loss, unspecified ear: Secondary | ICD-10-CM | POA: Insufficient documentation

## 2008-04-10 ENCOUNTER — Telehealth (INDEPENDENT_AMBULATORY_CARE_PROVIDER_SITE_OTHER): Payer: Self-pay | Admitting: *Deleted

## 2008-04-14 ENCOUNTER — Telehealth (INDEPENDENT_AMBULATORY_CARE_PROVIDER_SITE_OTHER): Payer: Self-pay | Admitting: *Deleted

## 2008-04-14 ENCOUNTER — Encounter (INDEPENDENT_AMBULATORY_CARE_PROVIDER_SITE_OTHER): Payer: Self-pay | Admitting: Family Medicine

## 2008-04-28 ENCOUNTER — Encounter (INDEPENDENT_AMBULATORY_CARE_PROVIDER_SITE_OTHER): Payer: Self-pay | Admitting: Family Medicine

## 2008-06-16 ENCOUNTER — Ambulatory Visit: Payer: Self-pay | Admitting: Family Medicine

## 2008-06-24 ENCOUNTER — Encounter (INDEPENDENT_AMBULATORY_CARE_PROVIDER_SITE_OTHER): Payer: Self-pay | Admitting: Family Medicine

## 2008-07-14 ENCOUNTER — Ambulatory Visit: Payer: Self-pay | Admitting: Family Medicine

## 2008-08-08 ENCOUNTER — Inpatient Hospital Stay (HOSPITAL_COMMUNITY): Admission: EM | Admit: 2008-08-08 | Discharge: 2008-08-10 | Payer: Self-pay | Admitting: Emergency Medicine

## 2008-08-14 ENCOUNTER — Ambulatory Visit: Payer: Self-pay | Admitting: Family Medicine

## 2008-08-20 ENCOUNTER — Observation Stay (HOSPITAL_COMMUNITY): Admission: EM | Admit: 2008-08-20 | Discharge: 2008-08-21 | Payer: Self-pay | Admitting: Emergency Medicine

## 2008-08-27 ENCOUNTER — Ambulatory Visit: Payer: Self-pay | Admitting: Cardiology

## 2008-10-07 ENCOUNTER — Ambulatory Visit: Payer: Self-pay | Admitting: Internal Medicine

## 2008-10-08 ENCOUNTER — Encounter: Payer: Self-pay | Admitting: Internal Medicine

## 2008-11-08 ENCOUNTER — Emergency Department (HOSPITAL_COMMUNITY): Admission: EM | Admit: 2008-11-08 | Discharge: 2008-11-08 | Payer: Self-pay | Admitting: Emergency Medicine

## 2008-11-09 ENCOUNTER — Emergency Department (HOSPITAL_COMMUNITY): Admission: EM | Admit: 2008-11-09 | Discharge: 2008-11-09 | Payer: Self-pay | Admitting: Emergency Medicine

## 2008-11-10 ENCOUNTER — Telehealth (INDEPENDENT_AMBULATORY_CARE_PROVIDER_SITE_OTHER): Payer: Self-pay | Admitting: Family Medicine

## 2008-11-13 ENCOUNTER — Ambulatory Visit: Payer: Self-pay | Admitting: Family Medicine

## 2008-11-13 DIAGNOSIS — I509 Heart failure, unspecified: Secondary | ICD-10-CM | POA: Insufficient documentation

## 2008-11-13 DIAGNOSIS — E876 Hypokalemia: Secondary | ICD-10-CM | POA: Insufficient documentation

## 2008-11-13 LAB — CONVERTED CEMR LAB
Bilirubin Urine: NEGATIVE
Blood in Urine, dipstick: NEGATIVE
Glucose, Urine, Semiquant: NEGATIVE

## 2008-11-14 ENCOUNTER — Encounter (INDEPENDENT_AMBULATORY_CARE_PROVIDER_SITE_OTHER): Payer: Self-pay | Admitting: Family Medicine

## 2008-11-16 LAB — CONVERTED CEMR LAB
CO2: 23 meq/L (ref 19–32)
Chloride: 110 meq/L (ref 96–112)
Potassium: 3.8 meq/L (ref 3.5–5.3)
Sodium: 144 meq/L (ref 135–145)

## 2008-11-18 ENCOUNTER — Encounter (INDEPENDENT_AMBULATORY_CARE_PROVIDER_SITE_OTHER): Payer: Self-pay | Admitting: Family Medicine

## 2008-11-19 ENCOUNTER — Encounter (INDEPENDENT_AMBULATORY_CARE_PROVIDER_SITE_OTHER): Payer: Self-pay | Admitting: Family Medicine

## 2008-12-18 ENCOUNTER — Ambulatory Visit: Payer: Self-pay | Admitting: Family Medicine

## 2008-12-18 ENCOUNTER — Ambulatory Visit (HOSPITAL_COMMUNITY): Admission: RE | Admit: 2008-12-18 | Discharge: 2008-12-18 | Payer: Self-pay | Admitting: Family Medicine

## 2008-12-18 ENCOUNTER — Telehealth (INDEPENDENT_AMBULATORY_CARE_PROVIDER_SITE_OTHER): Payer: Self-pay | Admitting: *Deleted

## 2008-12-18 DIAGNOSIS — D485 Neoplasm of uncertain behavior of skin: Secondary | ICD-10-CM

## 2008-12-18 DIAGNOSIS — M79609 Pain in unspecified limb: Secondary | ICD-10-CM

## 2008-12-21 ENCOUNTER — Telehealth (INDEPENDENT_AMBULATORY_CARE_PROVIDER_SITE_OTHER): Payer: Self-pay | Admitting: *Deleted

## 2008-12-21 ENCOUNTER — Encounter (INDEPENDENT_AMBULATORY_CARE_PROVIDER_SITE_OTHER): Payer: Self-pay | Admitting: Family Medicine

## 2008-12-28 ENCOUNTER — Ambulatory Visit: Payer: Self-pay | Admitting: Family Medicine

## 2008-12-29 ENCOUNTER — Encounter (INDEPENDENT_AMBULATORY_CARE_PROVIDER_SITE_OTHER): Payer: Self-pay | Admitting: Family Medicine

## 2009-01-05 ENCOUNTER — Telehealth (INDEPENDENT_AMBULATORY_CARE_PROVIDER_SITE_OTHER): Payer: Self-pay | Admitting: *Deleted

## 2009-01-05 ENCOUNTER — Ambulatory Visit: Payer: Self-pay | Admitting: Family Medicine

## 2009-01-07 ENCOUNTER — Encounter (INDEPENDENT_AMBULATORY_CARE_PROVIDER_SITE_OTHER): Payer: Self-pay | Admitting: Family Medicine

## 2009-01-14 ENCOUNTER — Encounter: Payer: Self-pay | Admitting: Internal Medicine

## 2009-01-18 ENCOUNTER — Encounter (INDEPENDENT_AMBULATORY_CARE_PROVIDER_SITE_OTHER): Payer: Self-pay | Admitting: Family Medicine

## 2009-02-02 ENCOUNTER — Encounter (INDEPENDENT_AMBULATORY_CARE_PROVIDER_SITE_OTHER): Payer: Self-pay | Admitting: Family Medicine

## 2009-02-09 ENCOUNTER — Encounter (INDEPENDENT_AMBULATORY_CARE_PROVIDER_SITE_OTHER): Payer: Self-pay | Admitting: Family Medicine

## 2009-04-16 ENCOUNTER — Ambulatory Visit: Payer: Self-pay | Admitting: Family Medicine

## 2009-04-27 ENCOUNTER — Encounter (INDEPENDENT_AMBULATORY_CARE_PROVIDER_SITE_OTHER): Payer: Self-pay | Admitting: Family Medicine

## 2009-05-20 ENCOUNTER — Ambulatory Visit: Payer: Self-pay | Admitting: Family Medicine

## 2009-05-20 DIAGNOSIS — M25569 Pain in unspecified knee: Secondary | ICD-10-CM

## 2010-04-29 ENCOUNTER — Encounter (INDEPENDENT_AMBULATORY_CARE_PROVIDER_SITE_OTHER): Payer: Self-pay | Admitting: *Deleted

## 2010-07-01 ENCOUNTER — Encounter: Payer: Self-pay | Admitting: Internal Medicine

## 2010-10-02 ENCOUNTER — Encounter (INDEPENDENT_AMBULATORY_CARE_PROVIDER_SITE_OTHER): Payer: Self-pay | Admitting: Internal Medicine

## 2010-10-11 NOTE — Letter (Signed)
Summary: Device-Delinquent Check  [facility], Main Office  [address]   [place], [place] [address]   Phone: [phone]  Fax: [phone]     April 29, 2010 MRN: [id number]   [name] [address] [place], [place]  [address]   Dear Ms. Masden,  According to our records, you have not had your implanted device checked in the recommended period of time.  We are unable to determine appropriate device function without checking your device on a regular basis.  Please call our office to schedule an appointment, with Dr. Ladona Ridgel,  as soon as possible.  If you are having your device checked by another physician, please call us so that we may update our records.  Thank you,  Altha Harm, LPN  April 29, 2010 3:08 PM  [facility]  certified mail

## 2010-10-11 NOTE — Cardiovascular Report (Signed)
Summary: Certified Letter - Returned  Certified Letter - Returned   Imported By: Debby Freiberg 07/20/2010 14:31:44  _____________________________________________________________________  External Attachment:    Type:   Image     Comment:   External Document

## 2010-12-22 LAB — DIFFERENTIAL
Basophils Absolute: 0 10*3/uL (ref 0.0–0.1)
Basophils Relative: 1 % (ref 0–1)
Eosinophils Relative: 1 % (ref 0–5)
Lymphocytes Relative: 26 % (ref 12–46)
Monocytes Absolute: 0.4 10*3/uL (ref 0.1–1.0)
Neutro Abs: 4.2 10*3/uL (ref 1.7–7.7)

## 2010-12-22 LAB — CBC
HCT: 35.8 % — ABNORMAL LOW (ref 36.0–46.0)
MCV: 85.1 fL (ref 78.0–100.0)
RBC: 4.2 MIL/uL (ref 3.87–5.11)
WBC: 6.3 10*3/uL (ref 4.0–10.5)

## 2010-12-22 LAB — COMPREHENSIVE METABOLIC PANEL
AST: 27 U/L (ref 0–37)
Albumin: 3 g/dL — ABNORMAL LOW (ref 3.5–5.2)
Alkaline Phosphatase: 84 U/L (ref 39–117)
BUN: 10 mg/dL (ref 6–23)
CO2: 27 mEq/L (ref 19–32)
Chloride: 105 mEq/L (ref 96–112)
Creatinine, Ser: 0.93 mg/dL (ref 0.4–1.2)
GFR calc non Af Amer: 58 mL/min — ABNORMAL LOW (ref >60)
Potassium: 3.4 mEq/L — ABNORMAL LOW (ref 3.5–5.1)
Total Bilirubin: 0.5 mg/dL (ref 0.3–1.2)

## 2010-12-22 LAB — URINALYSIS, ROUTINE W REFLEX MICROSCOPIC
Bilirubin Urine: NEGATIVE
Hgb urine dipstick: NEGATIVE
Ketones, ur: 15 mg/dL — AB
Specific Gravity, Urine: 1.016 (ref 1.005–1.030)
pH: 7.5 (ref 5.0–8.0)

## 2010-12-22 LAB — POCT CARDIAC MARKERS
CKMB, poc: <1 ng/mL — ABNORMAL LOW (ref 1.0–8.0)
Troponin i, poc: <0.05 ng/mL (ref 0.00–0.09)
Troponin i, poc: <0.05 ng/mL (ref 0.00–0.09)

## 2010-12-22 LAB — URINE MICROSCOPIC-ADD ON

## 2010-12-22 LAB — URINE CULTURE: Colony Count: >=100000

## 2010-12-22 LAB — BRAIN NATRIURETIC PEPTIDE: Pro B Natriuretic peptide (BNP): 260 pg/mL — ABNORMAL HIGH (ref 0.0–100.0)

## 2010-12-27 LAB — POCT CARDIAC MARKERS
CKMB, poc: <1 ng/mL — ABNORMAL LOW (ref 1.0–8.0)
Troponin i, poc: <0.05 ng/mL (ref 0.00–0.09)

## 2010-12-27 LAB — DIFFERENTIAL
Eosinophils Relative: 1 % (ref 0–5)
Lymphocytes Relative: 23 % (ref 12–46)
Lymphs Abs: 1.5 10*3/uL (ref 0.7–4.0)
Monocytes Relative: 7 % (ref 3–12)

## 2010-12-27 LAB — BASIC METABOLIC PANEL
BUN: 9 mg/dL (ref 6–23)
Chloride: 108 mEq/L (ref 96–112)
GFR calc Af Amer: >60 mL/min (ref >60)
GFR calc non Af Amer: 54 mL/min — ABNORMAL LOW (ref >60)
Potassium: 3.1 mEq/L — ABNORMAL LOW (ref 3.5–5.1)
Sodium: 140 mEq/L (ref 135–145)

## 2010-12-27 LAB — CBC
HCT: 38.3 % (ref 36.0–46.0)
MCV: 86.2 fL (ref 78.0–100.0)
Platelets: 222 10*3/uL (ref 150–400)
RBC: 4.44 MIL/uL (ref 3.87–5.11)
WBC: 6.7 10*3/uL (ref 4.0–10.5)

## 2011-01-24 NOTE — Cardiovascular Report (Signed)
[name], [name]                ACCOUNT NO.:  [id number]   MEDICAL RECORD NO.:  [id number]          PATIENT TYPE:  OIB   LOCATION:  NA                           FACILITY:  [facility]   PHYSICIAN:  Darlin Priestly, MD  DATE OF BIRTH:  11/23/1926   DATE OF PROCEDURE:  05/01/2007  DATE OF DISCHARGE:                            CARDIAC CATHETERIZATION   PROCEDURE:  1. Left heart catheterization.  2. Coronary angiogram.  3. Left ventriculogram.   COMPLICATIONS:  None.   INDICATIONS:  Ms.  Ayotte is a 77 year old female patient of Dr.  Erby Pian in [place] as well as Dr. Kem Boroughs with a history of  hypertension and chest pain.  She underwent a Cardiolite scan suggesting  no ischemia, though she was known to have severely depressed EF.  Echocardiogram also confirmed EF approximately 30% with multiple wall  motion abnormalities.  She is now referred for a left heart  catheterization to rule out significant CAD.   DESCRIPTION OF OPERATION:  After obtaining informed consent, the patient  was brought to the cardiac cath lab, where the right and left groin were  shaved, prepped and draped in the usual sterile fashion.  An ECG monitor  was established.  Using the modified Seldinger technique, a #6 French  arterial sheath was inserted in the right femoral artery.  A 6-French  diagnostic catheter was used to perform using diagnostic angiography.   The left main is a large vessel with no significant disease.   The LAD is a large vessel which coursed through the apex and gives rise  to two diagonal branches.  The LAD is known to have mild diffuse 30%  proximal-to-mid narrowing.   The first diagonal is a medium-sized vessel with no significant disease.   The second diagonal is a medium-sized vessel with 50% ostial lesion.   The left circumflex is a medium-sized vessel which coursed through the  AV groove and gives rise to two obtuse marginal branches.  The AV  circumflex has no severe  disease.   The first OM is a medium-sized vessel with no significant disease.   The second OM is a medium-sized vessel with no significant disease.   The right coronary artery is a large vessel, which is dominant, gives  rise to the PDA as well as posterolateral branch.  There is no  significant disease in the RCA.   The PDA is a medium-sized vessel with 40-50% mid narrowing which  bifurcates in its proximal segment with no severe disease.  The PLA has  no significant disease.   The left ventricle reveals a severely depressed EF of 25-30% with global  hypokinesis.   Hemodynamic systemic arterial pressure 138/66, LV systemic system  pressure 138/2, LVEDP of 6.   CONCLUSIONS:  1. No significant coronary artery disease.  2. Moderate-to-severely depressed LV systolic function.      Darlin Priestly, MD  Electronically Signed     RHM/MEDQ  D:  05/01/2007  T:  05/01/2007  Job:  [id number]   cc:   Franchot Heidelberg, M.D.  Dani Gobble, MD

## 2011-01-24 NOTE — Assessment & Plan Note (Signed)
[facility]                         ELECTROPHYSIOLOGY OFFICE NOTE   NAME:ALCORNGeetika, Laborde                       MRN:          [id number]  DATE:04/02/2008                            DOB:          07/17/1927    [name] returns today for followup.  She is a very pleasant elderly  woman with a history of nonischemic cardiomyopathy and congestive heart  failure who is status post ICD insertion.  She has had 2-lead  dislodgements.  Most recently, she was found to have her defibrillator  lead having had retracted into the superior vena cava/right atrial  junction.  When her pocket was opened for revision, the lead was found  to havetwiddled the lead.  It had approximately 20 revolutions of  twisting on the lead itself.  Interestingly enough, the atrial lead did  not get dislodged.  The patient returns today for followup.  She has had  no fevers or chills.  She has, otherwise, been stable.  She denies chest  pain or shortness of breath.   Her medications include, BiDil 20/37.5 t.i.d., potassium supplement,  amiodarone 200 a day, Coreg 25 twice a day, lisinopril 2.5 daily,  Nemenda, and omeprazole 20 a day.   PHYSICAL EXAMINATION:  GENERAL:  She is a pleasant elderly woman in no  acute distress.  VITAL SIGNS:  Blood pressure was 112/74, the pulse 72 and regular,  respirations were 18, and weight was 163 pounds.  NECK:  No jugular venous distention.  LUNGS:  Clear bilaterally to auscultation.  No wheezes, rales, or  rhonchi are present.  CARDIOVASCULAR:  Regular rate and rhythm.  Normal S1 and S2.  The ICD  insertion site was healed nicely.  The Steri-Strips were removed.  The  skin and the incision was healed very well.  There is no hematoma.  EXTREMITIES:  No edema.   Today, we did a chest x-ray to assess whether or not there was ongoing  twisting of her lead and the defibrillator lead was in satisfactory  position.  P and R waves were 2 and 11  respectively.  The impedance 650  and the A 440 and V threshold was 0.5 at 0.5 in the A and 1.25 at 0.5 in  the RV.  Battery voltage was 3.15 volts.  She was 17% A paced and 7% V  paced.   IMPRESSION:  1. Nonischemic cardiomyopathy.  2. Congestive heart failure.  3. 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   DISCUSSION:  [name] is stable and there is no evidence that her lead  has had any twisting or twiddling and there is plenty of slack still in  the defibrillator lead.  We plan to see her back in 3  or 4 months and repeat her x-rays to see that there has been no change  in the tension on the leads.     Doylene Canning. Ladona Ridgel, MD  Electronically Signed    GWT/MedQ  DD: 04/02/2008  DT: 04/03/2008  Job #: [id number]

## 2011-01-24 NOTE — H&P (Signed)
NAMERODNESHA, ELIE                ACCOUNT NO.:  [id number]   MEDICAL RECORD NO.:  [id number]          PATIENT TYPE:  INP   LOCATION:  A335                          FACILITY:  [facility]   PHYSICIAN:  Skeet Latch, DO    DATE OF BIRTH:  12-07-1926   DATE OF ADMISSION:  08/08/2008  DATE OF DISCHARGE:  LH                              HISTORY & PHYSICAL   PRIORITY ADMISSION HISTORY AND PHYSICAL   PRIMARY CARE PHYSICIAN:  Nelly Laurence, MD.   CHIEF COMPLAINT:  Chest pain.   HISTORY OF PRESENT ILLNESS:  This is a 77 year old Caucasian female, who  presents complaining of chest pain.  The patient states that the chest  pain occurred suddenly just prior to coming to the emergency room.  The  patient states that it is located across her chest, describes it as more  of a discomfort than an actual pain.  The patient does have some  shortness of breath, but no diaphoresis, nausea, abdominal pain, or any  other associated symptoms.  The patient denies any tingling or numbness  to her arms or her face.  The patient states that she did have a  pacemaker inserted approximately back in August of 2008 and has had no  chest pain since then.  She states that her daughter did give her 1  sublingual nitroglycerin, which did help some of the discomfort.  The  patient states that the discomfort is improving at this time.   PAST MEDICAL HISTORY:  Includes:  1. Atrial fibrillation.  2. Hypertension.  3. CHF with ejection fraction approximately 25%.  4. Dementia.  5. History of a nonsustained ventricular tachycardia.   FAMILY HISTORY:  Noncontributory.   SOCIAL HISTORY:  No history of tobacco, alcohol, or illicit drug use.   PAST SURGICAL HISTORY:  Pacemaker.   ALLERGIES:  1. MORPHINE.  2. CIPRO.   REVIEW OF SYSTEMS:  Otherwise negative except for HPI and her chest  discomfort and slight dyspnea.   HOME MEDICATIONS:  Unavailable at this time.   PHYSICAL EXAMINATION:  GENERAL:  She is  well-hydrated, well-developed,  and well-nourished in no acute distress.  HEENT:  Head is atraumatic and normocephalic.  Eyes are PERRLA, EOMI.  Oral mucosa is moist.  NECK:  Soft, supple, nontender, and nondistended.  HEART:  Regular rate and rhythm, no murmurs, rubs, or gallops.  LUNGS:  Clear to auscultation bilaterally, no rales, rhonchi, or  wheezing.  ABDOMEN:  Soft, nontender, and nondistended, positive bowel sounds, no  rigidity or guarding.  EXTREMITIES:  No clubbing, cyanosis, or edema, good pulses.  NEUROLOGICAL:  Cranial nerves II-XII are grossly intact.  Patient is  alert and oriented x3.  SKIN:  Normal, and no rashes are noted.  She does have some varicose  veins in her lower extremities.   ELECTROCARDIOGRAM:  Normal sinus rhythm at 64 BPMs, normal axis, normal  PR and QRS intervals, no ST T-wave changes, and no Q-waves.   LABORATORY DATA:  Her last set of cardiac enzymes showed a CK-MB of less  than 1, troponin less than 0.05, myoglobin 69.8.  Sodium 140, potassium  3.1, chloride at 109, CO2 is 26, glucose 123, BUN 7, creatinine 1.15.  White count is 6.1, hemoglobin 13.2, hematocrit 39.5, platelet count  233,000.   RADIOLOGIC STUDIES:  Chest x-ray showed:  1. An AICD is in place appearing similar to that of prior exam.  2. Mild septal pulmonary vascular prominence and basilar segmental      atelectasis.  3. No segmental regional consolidation.  4. Left hemidiaphragm calcifications suspected are unchanged.   ASSESSMENT:  1. Chest pain.  2. Hypokalemia.  3. History of atrial fibrillation.  4. History of hypertension.   PLAN:  1. The patient will be admitted to the service of [facility] to a      telemetry bed.  2. For her chest pain/discomfort, the patient will be placed on IV      pain medication, and we will get a third cardiac enzymes at this      time.  3. The patient will be placed on her home medications when available.  4. We will get a Cardiology  consult in the next 1 to 2 days if the      patient is still in house.  5. For her hypokalemia, the patient will be supplemented orally at      this time, and we will recheck her potassium in the a.m.  6. For her hypertension and atrial fibrillation, I am unsure if      patient is on medications.  I believe patient is on medications for      these problems and will add them when her home meds are reconciled      at this time.  7. The patient will be on DVT as well as GI prophylaxis.     Skeet Latch, DO  Electronically Signed    SM/MEDQ  D:  08/08/2008  T:  08/08/2008  Job:  [id number]   cc:   Franchot Heidelberg, M.D.

## 2011-01-24 NOTE — H&P (Signed)
NAMEJOYLYNN, [name]                ACCOUNT NO.:  [id number]   MEDICAL RECORD NO.:  [id number]          PATIENT TYPE:  EMS   LOCATION:  MAJO                         FACILITY:  [facility]   PHYSICIAN:  Abelino Derrick, P.A.   DATE OF BIRTH:  1927/01/25   DATE OF ADMISSION:  05/14/2007  DATE OF DISCHARGE:                              HISTORY & PHYSICAL   ADDENDUM:  [name] had had an abnormal chest CT in the past.  This  has been evaluated by Dr. Erby Pian.  She apparently has had a breast  biopsy and a PET scan, and according to the family Dr. Erby Pian does not  feel there is a malignancy.      Abelino Derrick, P.ALenard Cox  D:  05/14/2007  T:  05/14/2007  Job:  [id number]

## 2011-01-24 NOTE — H&P (Signed)
[name], [name] NO.:  [id number]   MEDICAL RECORD NO.:  [id number]          PATIENT TYPE:  EMS   LOCATION:  MAJO                         FACILITY:  [facility]   PHYSICIAN:  Ritta Slot, MD     DATE OF BIRTH:  09-23-1926   DATE OF ADMISSION:  11/09/2008  DATE OF DISCHARGE:  11/09/2008                              HISTORY & PHYSICAL   CHIEF COMPLAINT:  Chest pain.   HISTORY OF PRESENT ILLNESS:  [name] is a 77 year old female who was  followed by Dr. Domingo Sep in [place].  Her primary care doctor is Dr.  Erby Pian.  The patient has a history of a nonischemic cardiomyopathy.  She had a catheterization in 2008 that showed minor coronary artery  disease.  She did have a cardiomyopathy and ultimately, underwent an ICD  implant by Dr. Ladona Ridgel in August 2008.  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  She had a revision in December 2008 and again in  July 2009.  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  The  patient presented to the emergency room today with complaints of chest  pain.  Actually, she complains of big thump on her defibrillator.  She  is not describing angina.  We were asked to see the patient by [facility] because we apparently are her cardiologist and Dr. Ladona Ridgel is  her electrophysiologist.   PAST MEDICAL HISTORY:  Remarkable for dementia.  She has PAF and is on  amiodarone.  She has treated hypertension.   CURRENT MEDICATIONS:  Amiodarone 200 mg daily, BiDil t.i.d., Coreg 25 mg  b.i.d., potassium 20 mEq a day, and Prilosec 20 mg a day.   ALLERGIES:  She is allergic to MORPHINE and CIPRO.   SOCIAL HISTORY:  She is a nonsmoker.  She lives with her daughter.   FAMILY HISTORY:  Remarkable that her mother died in her 69s of coronary  artery disease.   REVIEW OF SYSTEMS:  Remarkable for chronic stitch abscess.  Apparently,  the patient had ruptured cecum in 1990, which resulted in a long  hospitalization  requiring multiple abdominal surgeries and hernia  repair.  The patient also has had a gallbladder surgery in the past.  She denies any GI bleeding or melena.  She has not had diabetes or  thyroid disease.  She has not had syncope.   PHYSICAL EXAMINATION:  VITAL SIGNS:  Blood pressure 143/74, pulse 68,  and temperature 97.6.  GENERAL:  She is well-developed elderly female, in no acute distress.  HEENT:  Normocephalic.  Extraocular movements are intact.  Sclerae is  anicteric.  NECK:  Without JVD.  CHEST:  Clear to auscultation and percussion.  CARDIAC:  Regular rate and rhythm with soft systolic murmur at the  aortic valve area.  Normal S1, S2.  EXTREMITIES:  Without edema.  ABDOMEN:  Multiple surgical scars.   LABORATORY DATA:  Sodium 141, potassium 3.4, BUN 10, creatinine 0.93.  White count 6.3, hemoglobin 12.3, hematocrit 35.8, platelets 187.  BNP  260.  Troponins were negative x2.  Chest x-ray  shows no acute process.  EKG is unremarkable.   IMPRESSION:  1. Atypical chest pain, possible if anything she had an implantable      cardioverter-defibrillator discharge.  2. History of nonischemic cardiomyopathy by catheterization in 2008,      her most recent echocardiogram that I can find was from December      2008 and showed her ejection fraction had improved to 55%.  3. Implantable cardioverter-defibrillator implanted in 2008 with      Fiedler syndrome requiring lead revisions in December 2008 and      July 2009.  4. Treated hypertension.  5. Paroxysmal atrial fibrillation, on amiodarone.  6. Chronic abdominal stitch abscess.   PLAN:  The patient was seen by Dr. Lynnea Ferrier in the emergency room.  We  will have her pacemaker interrogated.  We will set her up to see Dr.  Lynnea Ferrier in [place] for cardiology followup.  She will continue follow  up with Dr. Ladona Ridgel for her defibrillator and nonsustained V-tach.      Abelino Cox, P.A.      Ritta Slot, MD  Electronically  Signed    LKK/MEDQ  D:  11/09/2008  T:  11/10/2008  Job:  [id number]   cc:   Ritta Slot, MD  Doylene Canning. Ladona Ridgel, MD

## 2011-01-24 NOTE — Assessment & Plan Note (Signed)
[facility]                         ELECTROPHYSIOLOGY OFFICE NOTE   NAME:ALCORNAilee, Pates                       MRN:          [id number]  DATE:03/10/2008                            DOB:          12/14/1926    Ms. Montesano returns today for followup.  She is a very pleasant elderly  woman with a history of mild dementia, paroxysmal AFib, and VT, and a  nonischemic cardiomyopathy.  She is status post ICD implantation and  returns today for routine followup.  She has had no specific complaints.  She denies chest pain.  She denies shortness of breath.   MEDICATIONS:  1. BiDil 20/37.5 t.i.d.  2. Potassium 20 a day.  3. Amiodarone 200 a day.  4. Coreg 25 twice a day.  5. Aspirin 325 daily.  6. Lisinopril 2.5 daily.  7. Omeprazole 20 a day.   PHYSICAL EXAMINATION:  GENERAL:  She is a pleasant elderly-appearing  woman in no acute distress.  VITAL SIGNS:  Blood pressure is 112/64, the pulse is 72 and regular, and  the respirations are 18.  Weight is 163 pounds.  NECK:  No jugular venous distention.  LUNGS:  Clear bilaterally to auscultation.  No wheezes, rales, or  rhonchi.  CARDIOVASCULAR:  Regular rate and rhythm.  Normal S1 and S2.  EXTREMITIES:  Demonstrated no edema.   Interrogation of her defibrillator was carried out today.  The stent  demonstrates a St. Jude current model 2207, initially implanted in  September.  My records suggest she had a lead revision back in December.  Today, she unfortunately has R waves that measured only 1.6 down from 4  previously.  Her threshold is greater than 7.5 volts.  Her impedance is  330 ohms.  The battery voltage is 3.15 volts.   IMPRESSION:  1. Defibrillator lead dysfunction.  2. Nonischemic cardiomyopathy.  3. Congestive heart failure.  4. Paroxysmal atrial fibrillation.   DISCUSSION:  Ms. Rodriges defibrillator lead is not functioning  satisfactorily.  There were no programming changes that I can make  to  make it do so.  I have recommend that we go in for a  lead revision.  I have discussed the risks, benefits, goals, and  expectations of the procedure  with the patient and her son who was with her today and this will be  scheduled at earliest possible convenient time.     Doylene Canning. Ladona Ridgel, MD  Electronically Signed    GWT/MedQ  DD: 03/10/2008  DT: 03/11/2008  Job #: [id number]

## 2011-01-24 NOTE — Discharge Summary (Signed)
NAMEANTONELA, [name] NO.:  [id number]   MEDICAL RECORD NO.:  [id number]          PATIENT TYPE:  INP   LOCATION:  3738                         FACILITY:  [facility]   PHYSICIAN:  Maple Mirza, PA   DATE OF BIRTH:  [birth year]   DATE OF ADMISSION:  08/25/2007  DATE OF DISCHARGE:  08/28/2007                               DISCHARGE SUMMARY   ALLERGIES:  1. CIPRO.  2. MORPHINE SULFATE.   FINAL DIAGNOSES:  1. Admitted with syncope in setting of defective right ventricular      cardioverter defibrillator lead.  2. Discharging day 1 status post explantation of existing right      ventricular lead with implantation of a new right ventricular lead      for the patient's St. Jude cardioverter defibrillator.  3. Echocardiogram August 26, 2007, ejection fraction 55% showing      great improvement.   SECONDARY DIAGNOSES:  1. History of 2:1 heart block diagnosed May 2008 with symptomatic      bradycardia.  2. Catheterization for abnormal Myoview study May 01, 2007 showing      nonobstructive coronary artery disease with markedly depressed      ejection fraction 25-30%.  3. Implant dual-chamber cardioverter defibrillator May 22, 2007      by Dr. Ladona Ridgel for symptomatic bradycardia, nonsustained ventricular      tachycardia and severe nonischemic cardiomyopathy (a St. Jude      Current DR RF dual-chamber cardioverter-defibrillator.  4. Admitted May 31, 2007 with acute on chronic systolic [place]      Heart Association Class III congestive heart failure with weakness      and shortness of breath improving after 48 hours observation.  5. Paroxysmal atrial fibrillation controlled with amiodarone.  The      patient was in atrial fibrillation morning of the implant May 22, 2007 of ICD.  She had spontaneous conversion to sinus rhythm.      The patient is not on Coumadin, only aspirin.  6. Mild dementia.  7. Gastroesophageal reflux disease.   Dictation Ends at this [facility], [place]     GM/MEDQ  D:  08/28/2007  T:  08/28/2007  Job:  [id number]   cc:   Doylene Canning. Ladona Ridgel, MD  Dani Gobble, MD  Pryor Montes, MD

## 2011-01-24 NOTE — Discharge Summary (Signed)
NAMELATRONDA, SPINK NO.:  [id number]   MEDICAL RECORD NO.:  [id number]          PATIENT TYPE:  INP   LOCATION:  2037                         FACILITY:  [facility]   PHYSICIAN:  Maple Mirza, PA   DATE OF BIRTH:  1926-11-02   DATE OF ADMISSION:  05/22/2007  DATE OF DISCHARGE:  05/24/2007                               DISCHARGE SUMMARY   ALLERGIES:  SHE HAS ALLERGIES TO MORPHINE AND CIPRO AND BRADYCARDIA WITH  BETA BLOCKERS.   TIME SPENT AT DISCHARGE:  This dictation greater than 40 minutes.   FINAL DIAGNOSIS:  Discharging day #2 status post implant of a St. Jude  Current DR RF dual-chamber cardioverter-defibrillator for symptomatic  bradycardia in the setting of nonischemic cardiomyopathy, class II [place]  [place] Heart Association chronic systolic heart failure and nonsustained  ventricular tachycardia, Dr. Lewayne Bunting.   SECONDARY DIAGNOSES:  1. Recent hospitalization September 2 to May 17, 2007.      a.     Admitted with chest pain and dyspnea.      b.     Computed tomogram negative for pulmonary embolism.      c.     She was ruled out for acute myocardial infarction by serial       troponin I studies.      d.     Left heart catheterization this admission, nonobstructive       coronary artery disease, ejection fraction 25-30%.      e.     Admit rhythm was sinus rhythm with premature ventricular       contractions and bigeminy.      f.     Nonsustained ventricular tachycardia.      g.     Cardioverter-defibrillator defibrillator planned for       May 22, 2007 at electrophysiology consult.      h.     2:1 heart block on Lanoxin and metoprolol, both stopped       during the hospitalization September 2 to May 17, 2007.  2. Gastroesophageal reflux disease.  3. History of multiple abdominal surgeries.  4. Admission to the emergency room midnight on May 21, 2007.      a.     Admitted with fever, resolved after intravenous normal       saline  hydration.      b.     Abdominal tremors, resolved after intravenous normal saline       hydration.      c.     Potassium was 3.1.  The patient was given 40 mEq potassium       chloride.      d.     CT of the abdomen and pelvis, no acute process  5. New York Heart Association class II chronic systolic congestive      heart failure.   PROCEDURE:  May 22, 2007, implant dual-chamber cardioverter-  defibrillator, St. Jude device, by Dr. Lewayne Bunting.  The patient has  had no postprocedural complications.  Due to age and debilitation, she  was kept an extra day prior to discharge.  She is discharging  postprocedure day #2.   BRIEF HISTORY:  Ms. Calamari is a 77 year old female.  She presented the  morning of May 22, 2007 to short-stay C for cardioverter-  defibrillator implant.  On arrival, she was distressed.  She was gasping  for breath and complained of back pain.  She was immediately transferred  to the emergency room.  The EKG on arrival in the emergency room at  11:55 a.m. showed atrial fibrillation with rate 122 beats per minute.   The patient, although somewhat groggy and a poor historian, relates that  she experienced palpitation and dyspnea when she got out of the shower  this morning.  This feeling lasted until she got to the hospital.  We  attribute this to her new onset of atrial fibrillation which seems to  represent a new diagnosis.   Currently in the emergency room, she is comfortable on 3 liters of  oxygen.  Telemetry shows sinus rhythm with frequent PVCs and bigeminy.  Even with this rhythm, she says that the palpitations are much better  and she is not short of breath.  At no time did she  describe chest  pain, no lower extremity edema.  Chest x-ray shows a stable 13-mm right  middle lobe opacity but no acute process.  The lungs are clear  bilaterally.   The patient had presented yesterday at midnight, May 21, 2007 at  midnight, to the emergency room.   She had fever and abdominal tremors.  The patient has poor recollection of this, but the family has filled [place]  in on this.  Her temperature was 103 on admission at 1 o'clock in the  morning on May 21, 2007.  A CT of the abdomen and pelvis showed no  acute process.  Urinalysis was negative for nitrite, no leukocytosis  trace.  The patient was given IV normal saline.  Apparently, she had not  been eating for several days.  Once again, fever resolved as did the  abdominal tremors with infusion of normal saline.  The patient was  discharged at about 9:30 the morning of May 21, 2007 and told to  report back May 22, 2007 for ICD implant.   HOSPITAL COURSE:  The patient presented to short-stay C on May 22, 2007 with shortness of breath and panic.  She was transferred to the  emergency room.  Electrocardiogram showed that she was in atrial  fibrillation with rapid ventricular rate.  She did convert from that to  sinus rhythm on oxygen and with IV fluids.  Her base rhythm is sinus  rhythm with frequent PVCs and bigeminy.  She did convert to atrial  fibrillation briefly in the emergency room, but this was a transient  paroxysm, and she was not especially dyspneic for the 2-3  minute period  that it lasted.  The patient was thought to be stable enough to undergo  implantation of the cardioverter-defibrillator.  This was done May 22, 2007, with implantation of the St. Jude dual-chamber device.  The  patient has been fine in sinus rhythm with frequent ventricular ectopy  in the post-procedure period.  She had been started on amiodarone, and  her Lanoxin has been stopped.  Her other medications remain the same,  although she has also been started on Coreg.  Mobility of the left arm  has been discussed with the patient and so has incision care.  She is  asked to keep her incision dry for next 7  days and to sponge bathe until  Thursday, May 30, 2007.   DISCHARGE  MEDICATIONS:  1. Amiodarone 2 mg tablets, 2 tablets daily for atrial fibrillation.      This is a new medication.  2. Coreg 3.125 mg twice daily for cardiomyopathy.  This is a new      medication.  3. Prilosec 20 mg daily.  4. BiDil 20/37.5, 3 times daily.  5. Lisinopril 5 mg daily.  6. She is to stop Lanoxin.   FOLLOW UP:  She has followup appointments at [facility], [address] on Wednesday, June 12, 2007 at 9 o'clock, and she  will see Dr. Ladona Ridgel on Monday, August 26, 2007 at 11 at [facility], [place] office.  She sees Dr. Domingo Sep on  Friday, June 14, 2007 at 11 o'clock.  Her office visit for Friday,  May 31, 2007 has been canceled.   LABORATORY STUDIES PERTINENT TO THIS ADMISSION:  On May 22, 2007,  her hemoglobin was 11.5, hematocrit 34.6, white cells 6.4, platelets  183.  Serum electrolytes on the day of discharge 140, potassium 3,  chloride 106, carbonate 30, BUN is 7, creatinine 0.71, glucose is 91.  The patient has been, I am afraid, hypokalemic throughout this  hospitalization.  On May 22, 2007 on admission at 2 o'clock in the  afternoon, her potassium was 3.  Once again it remains at 3 today.  This  will be replenished and followed up.  Protime this admission 14.6, INR  1.1.  Alkaline phosphatase on May 21, 2007 was 64, SGOT 49, SGPT is  21.  Troponin I studies on admission May 22, 2007 was 0.04.  Urinalysis was negative this admission.  Her BNP on May 24, 2007  was 320.  BNP on admission May 22, 2007 was 239.   Once again, she will have a repeat of her basic metabolic panel on  Wednesday, June 26, 2007 when she comes in.  We will see her in  followup with Dr. Erby Pian if we can arrange that the week of May 27, 2007 to recheck her potassium.  We will send her home on a low dose  of potassium since she has been continuously low.      Maple Mirza,  PA     GM/MEDQ  D:  05/24/2007  T:  05/24/2007  Job:  [id number]   cc:   Dani Gobble, MD  Doylene Canning. Ladona Ridgel, MD  Franchot Heidelberg, M.D.

## 2011-01-24 NOTE — Discharge Summary (Signed)
[name], [name]                ACCOUNT NO.:  [id number]   MEDICAL RECORD NO.:  [id number]          PATIENT TYPE:  INP   LOCATION:  A335                          FACILITY:  [facility]   PHYSICIAN:  Skeet Latch, DO    DATE OF BIRTH:  [birth year]   DATE OF ADMISSION:  08/08/2008  DATE OF DISCHARGE:  11/30/2009LH                               DISCHARGE SUMMARY   DISCHARGE DIAGNOSES:  1. Chest pain resolved.  2. Hypokalemia resolved.  3. History of atrial fibrillation.  4. History of hypertension.  5. History of congestive heart failure, ejection fraction      approximately 25%.  6. History of dementia.   BRIEF HOSPITAL COURSE:  This is a 77 year old Caucasian female who  presented complaining of chest pain/discomfort that occurred prior to  coming to the emergency room.  The patient states that pain was located  across the chest.  It is described as mild discomfort more than actual  pain.  The patient did receive 1 nitroglycerin from family member.  It  did help ease some of the pain.  The patient had no shortness of breath,  diaphoresis, nausea, vomiting, abdominal pain, or any associate  symptoms.  The patient denies any tingling or numbness in the hands or  face.  The patient did have a pacemaker inserted back in August 2008 and  has been pain-free since that time.  The patient was admitted with the  above complaint.  Her cardiac enzymes have been unremarkable.  The  patient was found to be hypokalemic.  This was replaced orally.  Her  potassium has been in the normal range.  The patient has been pain-free  since her admission and [facility] was consulted.  They did  see the patient and felt that this patient had a negative cath 1 year  ago and it was felt that the patient could be discharged to home.  The  patient also had no EKG changes.  The patient does see Dr. Ladona Ridgel,  electrophysiologist in [place].  She does have an appointment with  him and Tresa Moore  will call the patient for followup appointment.  The patient did have a chest x-ray which showed:  1. AICDs in place appearing similar to that of prior exam.  2. Mild central pulmonary vascular prominence and basilar segmental      atelectasis.  No segmental lesion consolidation.  3. Left hemidiaphragm calcification suspected and unchanged.   VITALS ON DISCHARGE:  Temperature is 90.3, respirations 18, heart rate  61, blood pressure 139/63, and she is sating 93% on room air.   LABORATORY DATA:  Hemoglobin 12.5, hematocrit 37.8, white count 6.4,  platelet count 280,000.  Sodium 141, potassium 3.9, chloride 107, CO2  28, BUN 6, creatinine 1.07, and glucose 98.  Her troponin was 0.02, CK  is 69, CK-MB 1.5, cholesterol 138, LDL 79, HDL 38, triglycerides of 103.  Her BNP was 86.4.  TSH was 3.80.   CONDITION ON DISCHARGE:  Stable.   DISPOSITION:  The patient will be discharged to home.   MEDICATIONS ON DISCHARGE:  1. Amiodarone 200  mg 2 tablets daily.  2. Bidil 20/37.5 one tablet p.o. 3 times a day.  3. Potassium chloride 20 mEq once a day.  4. Omeprazole 20 mg once a day.  5. Coreg 6.25 mg twice a day.  6. Lisinopril previous dose once a day.  7. Namenda previous dose as directed.  8. Sanctura 60 mg 1 capsule nightly.   DISCHARGE INSTRUCTIONS:  The patient is to maintain a low-salt, heart-  healthy diet.  She is to increase her activity to previous activities.  The patient is to follow up with Dr. Erby Pian within the next week.  The  patient is to follow up with Dr. Ladona Ridgel at the next scheduled visit.  [facility] will call the patient regarding appointment with  them.  The patient is to take all medications as directed.  There was  some confusion regarding her home medications.  I would ask Dr. Erby Pian  to review her medication list to make sure the patient is taking her  medications as directed.      Skeet Latch, DO  Electronically Signed     SM/MEDQ  D:   08/10/2008  T:  08/11/2008  Job:  [id number]   cc:   Doylene Canning. Ladona Ridgel, MD  [address]  [address]  [place]  [place] [address]   Franchot Heidelberg, M.D.

## 2011-01-24 NOTE — Assessment & Plan Note (Signed)
[facility]                         ELECTROPHYSIOLOGY OFFICE NOTE   NAME:[name], [name]                       MRN:          [id number]  DATE:12/10/2007                            DOB:          1927-06-24    [name] returns today for followup of her defibrillator implant,  nonischemic cardiomyopathy and ventricular tachycardia and paroxysmal A  fib.  She is a very pleasant elderly woman with a history of all the  above problems along with dementia.  She has not been on Coumadin  therapy secondary to concerns of falls and underlying dementia.  The  patient has been stable.  She denies chest pain.  She had no specific  complaints today.   CURRENT MEDICATIONS:  Include Prilosec 20 a day, Bidil 20/37.5 t.i.d.,  potassium 20 a day, amiodarone 200 a day, Coreg 25 twice a day, aspirin  325 a day, trimethoprim 100 mg daily, lisinopril 2.5 daily, and Sanctura  XR 60 daily.   On physical exam she is a pleasant, elderly woman in no acute distress.  Blood pressure was 124/78, the pulse 68 and regular, respirations were  18.  Weight was 162 pounds.  NECK:  Revealed no jugular distention.  Lungs clear bilaterally to auscultation.  No wheezes, rales or rhonchi  are present and no increased work of breathing.  Cardiac exam revealed a regular rate and rhythm.  Normal S1, S2.  There  were no murmurs, rubs, or gallops.   Interrogation of her defibrillator demonstrates a St. Jude current DR  dual-chamber device.  The P and R waves were 2.5 and 5 respectively.  The impedance was 560 in the A and 310 the V.  The threshold was 0.5 at  0.5 in the atrium and 1.5 at 0.5 in the right ventricle.  Battery  voltage was greater than 3.2 volts.  The underlying rhythm was sinus.  There are no intercurrent IC therapies.  Today we turned her outputs  down to 3 at 0.5 in the ventricle.  She was 92% A paced and 30% V paced.   IMPRESSION:  1. Nonischemic cardiomyopathy.  2.  Congestive heart failure.  3. Status post ICD insertion with recent ICD lead revision.   DISCUSSION:  Overall [name] is stable and I have made no changes in  medicines or her defibrillator program except to decrease her RV output  to maximize battery longevity.  We have  also further increased her AV delay to minimize RV pacing.  Will see her  back in the office in 6 months.     Doylene Canning. Ladona Ridgel, MD  Electronically Signed    GWT/MedQ  DD: 12/10/2007  DT: 12/10/2007  Job #: [id number]   cc:   Roberta Gobble, MD

## 2011-01-24 NOTE — Discharge Summary (Signed)
[name], [name] NO.:  [id number]   MEDICAL RECORD NO.:  [id number]          PATIENT TYPE:  INP   LOCATION:  3738                         FACILITY:  [facility]   PHYSICIAN:  Maple Mirza, PA   DATE OF BIRTH:  [birth year]   DATE OF ADMISSION:  08/25/2007  DATE OF DISCHARGE:  08/28/2007                               DISCHARGE SUMMARY   ALLERGIES:  1. CIPRO.  2. MORPHINE SULFATE.   Dictation and exam greater than 35 minutes.   FINAL DIAGNOSES:  1. Admitted with syncope in setting of defective right ventricular      implantable cardioverter-defibrillator lead.  2. Discharging day 1 status post explant of a defective right      ventricular lead with implant of a new right ventricular lead.  3. Echocardiogram August 26, 2007, ejection fraction 55% which shows      a marked brief improvement.   SECONDARY DIAGNOSES:  1. History of 2:1 heart block diagnosed May 2008 with symptomatic      bradycardia.  2. Left heart catheterization for abnormal Myoview study May 01, 2007.  The study shows nonobstructive coronary artery disease,      marked decrease in left ventricular function, ejection fraction 25-      30%.  3. Implant of dual-chamber ischemic changes May 22, 2007 for      symptomatic bradycardia, nonsustained ventricular tachycardia and      nonischemic cardiomyopathy (St. Jude Current DR RF left dual-      chamber cardioverter defibrillator)  4. Admitted May 31, 2007 with acute on chronic New [place] Heart      Association Class systolic congestive heart failure.  5. Paroxysmal atrial fibrillation.  The patient is on amiodarone, not      on Coumadin, takes aspirin only.  She was noted to be in atrial      fibrillation on the morning of the implant of the ICD May 22, 2007, but had spontaneous conversion to sinus rhythm.  6. Gastroesophageal reflux disease.  7. Mild dementia.   PROCEDURE:  On August 27, 2007, explant of  defective right ventricular  lead which showed that the helix was dysfunctional and implant of a new  lead a Durata 7120 in the right ventricle. Defibrillator threshold study  less than or equal to 15 joules.  The patient had no post-procedural  hematoma at the implant pocket and ready for discharge with chest x-ray  showing that the lead is in appropriate position and postprocedure  interrogation showing that all values are within normal limits.   BRIEF HISTORY:  [name] is a 77 year old female.  She has a history  of nonischemic cardiomyopathy, congestive heart failure, 2:1  intermittent heart block was diagnosed Jan 17, 2007.  At that time, she  had catheterization.  She also had a repeat catheterization in August  2008.  The study showed no obstructive coronary artery disease, but  severe left ventricular dysfunction.  She ultimately underwent ICD  implant.   The patient has been doing well.  However, she  presented to the hospital  on August 25, 2007.  The patient's daughter says she was using the  bathroom, had chest pain, slumped over and passed out.  The patient  gradually awakened after the paramedics arrived.  She was given oxygen  and admitted for additional evaluation   The patient's symptoms are somewhat concerning for angina.  She does  have a history of ventricular lead dysfunction and serial cardiac  enzymes will be obtained.  She will be placed on telemetry.  She will  have her defibrillator lead revised this hospitalization.  A 2-D  echocardiogram will be obtained.  The patient was admitted August 25, 2007 through the emergency room.  Her troponin-I studies were negative x  3 at 0.02, then 0.01, then 0.02.  The 2-D echocardiogram was done  August 26, 2007 and showed ejection fraction of 55% which shows a  marked improvement from the catheterization in August where ejection  fraction was 25%.  She underwent cardioverter defibrillator revision  August 27, 2007 with explantation of the existing right ventricular  defibrillator lead with implantation of a new lead without complication.  The patient is discharging postprocedure day number 1.  Chest x-ray  shows that the lead is in appropriate position.  There is no  pneumothorax.  The patient has been cautioned to keep her incision dry  for the next 7 days and to sponge bathe until Tuesday, September 03, 2007.  Mobility of the left arm has been discussed.  She has been given  a sling.   DISCHARGE MEDICATIONS:  1. Coreg 3.125 mg twice daily.  2. Lisinopril 5 mg daily.  3. BiDil 20/37.5 three times daily.  4. Amiodarone 2 mg daily.  5. Omeprazole 20 mg daily.  6. Potassium chloride 20 mEq daily.  7. Cyanocobalamin 1000 mg injection vitamin B12 every month.  8. Enteric-coated aspirin 325 mg daily with meals.  9. Keflex 250 mg tablets 1 tablet 1/2 hour before breakfast, lunch,      dinner and at bedtime for 5 days.  This is a new medication.  A      prescription has been given to the patient.   FOLLOW UP:  1. [facility] [address] is Monday, September 16, 2007 at 9:40 a.m.  2. She follows up with Dr. Pryor Montes and Dr. Domingo Sep has been scheduled      or as needed.   LABORATORY STUDIES:  This admission on August 26, 2007:  White cells  5.6, hemoglobin 11.5, hematocrit 34.3, platelets 223.  Serum  electrolytes August 26, 2007:  Sodium 142, potassium 4.2, chloride  105, carbonate 29, BUN 5, creatinine 0.86, glucose is 83.  The alkaline  phosphatase this admission is 67, SGOT is 28, SGPT is 18.      Maple Mirza, PA     GM/MEDQ  D:  08/28/2007  T:  08/28/2007  Job:  [id number]   cc:   Doylene Canning. Ladona Ridgel, MD  Pryor Montes, MD  Dani Gobble, MD

## 2011-01-24 NOTE — Assessment & Plan Note (Signed)
[facility]                         ELECTROPHYSIOLOGY OFFICE NOTE   NAME:ALCORNYessenia, Maillet                       MRN:          [id number]  DATE:10/07/2008                            DOB:          1927/06/01    Ms. Moga returns today for followup.  She is a very pleasant woman  with a history of nonischemic cardiomyopathy and congestive heart  failure status post ICD insertion.  The patient had 2 lead dislodgements  in the past and she returns today for followup.  She has done well since  the summer once we had to revise her defibrillator lead.  At that time,  the lead had retracted back into the SVC.  The patient states that she  has done well in the interim.  She denies chest pain.  She denies  shortness of breath.  She has had no intercurrent IC therapies.   CURRENT MEDICATIONS:  1. Amiodarone 200 a day.  2. Potassium 20 a day.  3. BiDil 20/37.5mg  three times a day.  4. Lisinopril 2.5 daily.  5. Omeprazole 20 a day.  6. Namenda 10 mg twice a day.   PHYSICAL EXAMINATION:  GENERAL:  She is pleasant elderly woman in no  distress.  VITAL SIGNS:  Blood pressure was 120/68, the pulse 66 and regular,  respirations were 18, weight was 150 pounds, down 13 pounds from her  visit back in the summer.  NECK:  No jugular venous distention.  LUNGS:  Clear bilaterally to auscultation.  No wheezes, rales, or  rhonchi are present.  There is no increased work of breathing.  CARDIOVASCULAR:  Regular rate and rhythm.  Normal S1 and S2.  ABDOMINAL:  Soft, nontender.  EXTREMITIES:  No edema.   Interrogation of her defibrillator demonstrates a St. Jude current  device.  The P-waves were 2.  The R-waves were greater than 12, the  impedance 750 in the A and the RV, the threshold 0.5 at 0.5 in the  atrium, 1.25 at 0.8 in the right ventricle.  Battery voltage was 3.15  volts.  There are no intercurrent IC therapies.  She was 12% V paced, 7%  A paced.  Estimated  longevity was 5 years.  Today, we turned our paced  AV delay from 250-275 to minimize ventricular pacing.   IMPRESSION:  1. Nonischemic cardiomyopathy.  2. Congestive heart failure.  3. Status post implantable cardioverter-defibrillator insertion with      prior lead dislodgement status post replacement.    Doylene Canning. Ladona Ridgel, MD  Electronically Signed   GWT/MedQ  DD: 10/07/2008  DT: 10/08/2008  Job #: [id number]

## 2011-01-24 NOTE — Op Note (Signed)
[name], [name] NO.:  [id number]   MEDICAL RECORD NO.:  [id number]          PATIENT TYPE:  INP   LOCATION:  2037                         FACILITY:  [facility]   PHYSICIAN:  Doylene Canning. Ladona Ridgel, MD    DATE OF BIRTH:  Aug 03, 1927   DATE OF PROCEDURE:  05/22/2007  DATE OF DISCHARGE:                               OPERATIVE REPORT   PROCEDURE PERFORMED:  Implantation of a dual-chamber ICD.   INDICATION:  Symptomatic bradycardia in the setting of a nonischemic  cardiomyopathy, class II heart failure and nonsustained VT.   INTRODUCTION:  The patient is a 77 year old woman with longstanding  congestive heart failure, severe LV dysfunction, who has a history of  symptomatic bradycardia as well as atrial fibrillation, as well as  ventricular tachycardia.  She is now referred today for ICD therapy.  She was noted on arrival to our short stay center to be short of breath  and was found to be in atrial fibrillation which terminated  spontaneously after being sent to the emergency department.  She is now  referred for ICD implantation.   PROCEDURE:  After informed consent was obtained, the patient was taken  to the diagnostic EP lab in the fasting state.  After usual preparation  and draping, intravenous fentanyl and midazolam was given for sedation.  Lidocaine, 30 mL, was infiltrated in the left infraclavicular region.  A  7 cm incision was carried out over this region.  Electrocautery was  utilized to dissect down to the fascial plane.  We injected 10 mL of  contrast into the left upper extremity venous system demonstrating a  patent left subclavian vein.  It was subsequently punctured x2 and the  St. Jude model 7120 Durata defibrillation lead, serial number [id number],  was advanced in the right ventricle and the St. Jude model 1699T, 46 cm  active fixation pacing lead, serial number [id number], was advanced into  the right atrium.  Mapping was subsequently carried out in the  right  ventricle.  At the final site the R-waves were 12, the impedance 723  ohms, threshold 0.4 volts at 0.5 milliseconds.  A 10-volt pacing did not  stimulate the diaphragm.  With the ventricular lead in satisfactory  position, attention was then turned to placement of the atrial lead  which was placed in the anterolateral portion of the right atrium where  P-waves were 3 mV and the pacing impedance 565 ohms with lead actively  fixed.  Threshold was 1 volt at 0.5 milliseconds.  A 10-volt pacing in  the atrium did not stimulate the diaphragm.  With these satisfactory  parameters, the leads were secured to the subpectoralis fascia with a  figure-of-eight silk suture.  The sewing sleeve was also secured with  silk suture.  Electrocautery was utilized to make a subcutaneous pocket.  Kanamycin irrigation was utilized to irrigate the pocket and  electrocautery was utilized to assure hemostasis.  The St. Jude current  DRRF dual-chamber defibrillator, serial number [id number], was connected to  the defibrillation lead and the atrial pacing lead and placed back in  the subcutaneous  pocket.  It was secured with silk suture.  At this  point, the pocket was irrigated with kanamycin and defibrillation  threshold testing carried out.   After the patient more deeply sedated with fentanyl and Versed, VF was  induced with a T-wave shock and a 15 joule shock was delivered which  terminated VF and restored sinus rhythm.  At this point no additional  defibrillation threshold testing was carried out and the incision was  closed with a layer of 2-0 Vicryl, followed by a layer of 3-0 Vicryl,  followed by a layer of 4-0 Vicryl.  Benzoin was painted on the skin.  Steri-Strips were applied and pressure dressing was placed.  The patient  was returned to her room in satisfactory condition.   COMPLICATIONS:  There were no immediate procedure complications.   RESULTS:  This demonstrates successful implantation of a  Medtronic dual-  chamber ICD in a patient with a nonischemic cardiomyopathy, congestive  heart failure, sinus bradycardia, ventricular tachycardia and atrial  fibrillation.      Doylene Canning. Ladona Ridgel, MD  Electronically Signed     GWT/MEDQ  D:  05/22/2007  T:  05/23/2007  Job:  [id number]

## 2011-01-24 NOTE — H&P (Signed)
[name], [name] NO.:  [id number]   MEDICAL RECORD NO.:  [id number]          PATIENT TYPE:  INP   LOCATION:  3742                         FACILITY:  [facility]   PHYSICIAN:  Brayton El, MD    DATE OF BIRTH:  Sep 14, 1926   DATE OF ADMISSION:  05/30/2007  DATE OF DISCHARGE:                              HISTORY & PHYSICAL   CHIEF COMPLAINT:  Palpitations.   HISTORY OF PRESENT ILLNESS:  [name] is a 77 year old white female.  Past medical history significant for nonischemic cardiomyopathy, history  of symptomatic bradycardia and 2:1 AV block, status post Medtronic dual-  chamber pacer/ICD placed May 22, 2007, presenting today with a 12-  hour history of chest pounding.  The patient states that all day she  has felt her chest pounding.  She does endorse some associated shortness  of breath this evening.  The patient does deny any chest pressure,  dizziness, syncope, PND, orthopnea or lower extremity edema.  The  patient states, however, with chest pounding and shortness of breath,  she has felt more fatigued than usual.  The patient states she has been  compliant with all of her medications and is otherwise without  complaints.   PAST MEDICAL HISTORY:  1. Nonischemic cardiomyopathy with an ejection fraction of 25 to 30%.      Her last cardiac catheterization was in August of this year showing      non-obstructive coronary disease.  2. She has a history of nonsustained V-tach.  3. She has a history of Wenckebach's as well as 2:1 AV block.  4. She has a history of peritonitis and abdominal surgeries and she      has a history of GERD.   SOCIAL HISTORY:  The patient lives with her daughter.  She denies any  tobacco use.   FAMILY HISTORY:  Negative for premature coronary disease.   ALLERGIES:  SHE IS ALLERGIC TO MORPHINE AND CIPRO.   MEDICATIONS:  1. Amiodarone 400 mg daily.  2. BiDil 20/37.5 t.i.d.  3. Coreg 3.125 mg b.i.d.  4. Lisinopril 5 mg  daily.  5. Prilosec 20 mg daily.  6. Potassium chloride 20 mEq daily.   REVIEW OF SYSTEMS:  As in HPI.  All other systems reviewed and are  negative.   PHYSICAL EXAMINATION:  VITAL SIGNS:  She has a temperature of 97.1.  Pulse of 91.  Respiratory rate 20.  Blood pressure of 117/59.  Satting  96% on room air.  GENERAL:  She is in no acute distress.  HEENT:  Normocephalic, atraumatic.  Nonfocal.  Neck is supple without  carotid bruits.  There is no JVD.  HEART:  Regular rate and rhythm with a 2/6 systolic murmur heard loudest  at the left sternal border.  LUNGS:  Clear to auscultation bilaterally.  ABDOMEN:  Soft, nontender, nondistended.  EXTREMITIES:  Without edema.  NEURO EXAM:  Nonfocal.  PSYCHIATRIC:  The patient is fatigued, but appropriate.  MUSCULOSKELETAL:  The patient has 5/5 strength in bilateral upper and  lower extremities.  SKIN:  There are no rashes or lesions.  LABORATORY DATA:  EKG independently reviewed by myself shows normal  sinus rhythm with frequent PVCs in a bigeminal pattern.  Her rate is 91.  Compared with EKG dated May 22, 2007, PVCs are now present.  Her  chest x-ray shows a dual-chamber ICD with leads in place.  There is no  evidence of acute cardiopulmonary disease.  Labs:  The patient has a  white count of 7.9, hemoglobin 11.8, hematocrit 35.5, platelets 314,000.  Her troponin is less than 0.05, her MB is 1.4.  Her BMP is pending at  the time of dictation.   ASSESSMENT:  A 77 year old white female with a nonischemic  cardiomyopathy and history of heart block, status post Medtronic dual-  chamber ICD placement, presenting now with palpitations that are likely  secondary to the multiple premature ventricular contractions she is  experiencing.  The patient has recently had a problem with hypokalemia  and is on potassium supplementation at home.  It is certainly possible  that she is once again hypokalemic, although her basic metabolic panel  is  pending at the time of this dictation.  Other possible etiology for  the frequent premature ventricular contractions she is having is simply  her structural heart disease that she does have a known nonischemic  cardiomyopathy.   PLAN:  We will admit the patient to telemetry.  We will increase her  Coreg from 3.125 mg b.i.d. to 6.25 mg p.o. b.i.d.  We will continue her  amiodarone 400 mg daily, lisinopril 5 mg daily and BiDil t.i.d.  We will  place her on Lovenox 40 mg subcu daily for DVT prophylaxis and place her  on the cardiac diet.  If indeed her potassium comes back low, we will  replace it with oral and, if needed, IV potassium.  We will not continue  to rule the patient out for myocardial infarction as she is not having  any symptoms of angina and she had a recent cath showing non-obstructive  coronary disease.      Brayton El, MD     SGA/MEDQ  D:  05/30/2007  T:  05/31/2007  Job:  [id number]

## 2011-01-24 NOTE — H&P (Signed)
[name], [name]                ACCOUNT NO.:  [id number]   MEDICAL RECORD NO.:  [id number]           PATIENT TYPE:   LOCATION:                                 FACILITY:   PHYSICIAN:  Abelino Derrick, P.A.   DATE OF BIRTH:  Sep 30, 1926   DATE OF ADMISSION:  DATE OF DISCHARGE:                              HISTORY & PHYSICAL   ADDENDUM   [name] had her ICD interrogated and there has been no new activity  noted.  She did have some PVCs on her initial EKG and this may be the  cause of her symptoms.  After discussion with Dr. Lynnea Ferrier, she was  discharged home and she will see him in followup in [place].      Abelino Derrick, P.ALenard Cox  D:  11/09/2008  T:  11/10/2008  Job:  [id number]

## 2011-01-24 NOTE — Assessment & Plan Note (Signed)
[facility]                                 ON-CALL NOTE   NAME:ALCORNMabeline, [name]                       MRN:          [id number]  DATE:05/30/2007                            DOB:          Jun 11, 1927    PRIMARY CARDIOLOGIST:  Doylene Canning. Ladona Ridgel, M.D.   I received a phone call from Mrs. Dyar's son secondary to the  patient's complaint of shortness of breath which she describes as  palpitations.  Her son states that she has become anxious and was  concerned about palpitations.  He states that he checked her pulse and  it was in the 80s, but she continued to be short of breath, and he was  concerned about what should be done.   The patient was recently discharged on May 24, 2007 after having  chest pain and dyspnea.  She as ruled out for a myocardial infarction,  had a cardiac catheterization revealing nonobstructive coronary artery  disease, with an EF of 25%-30%.  The patient ended up with a  cardioverter/defibrillator which was implanted on May 22, 2007 per  Dr. Ladona Ridgel.  The patient was sent home on May 24, 2007 without  further incident.  The patient was started on amiodarone, Coreg,  Prilosec, Bidyl, lisinopril, and advised to stop taking Lanoxin.   I have discussed this with the patient's son and advised him to bring  the patient to the emergency room or call EMS if he did not feel that  she was stable enough to do well on a car ride there.  He states her  main complaint was shortness of breath, and he felt that he could bring  her, but I still advised EMS transport so that they could monitor her,  and he agreed.  The ER physician will evaluate her and make a  determination whether we need to be notified concerning these symptoms  versus noncardiac etiology for them.     Bettey Mare. Lyman Bishop, NP  Electronically Signed    KML/MedQ  DD: 05/30/2007  DT: 05/31/2007  Job #: [id number]

## 2011-01-24 NOTE — H&P (Signed)
NAMELADORIS, Cox NO.:  [id number]   MEDICAL RECORD NO.:  [id number]          PATIENT TYPE:  EMS   LOCATION:  MAJO                         FACILITY:  [facility]   PHYSICIAN:  Dani Gobble, MD       DATE OF BIRTH:  09/02/1927   DATE OF ADMISSION:  05/14/2007  DATE OF DISCHARGE:                              HISTORY & PHYSICAL   CHIEF COMPLAINT:  Chest pain.   HISTORY OF PRESENT ILLNESS:  [name] is a 77 year old female who has  been evaluated by Dr. Domingo Sep back in August.  She is a medical patient  of Dr. Franchot Heidelberg in [place].  She developed a history of  chest pain over the summer.  It seemed to be exertional.  She also had  some dyspnea on exertion.  Echocardiogram revealed normal LV function  but an EF of 30-35%.  A Myoview was done which showed no ischemia.  Diagnostic catheterization was recommended.  This was done May 01, 2007, by Dr. Jenne Campus.  This revealed a 40% distal RCA, normal left main,  normal circumflex, normal OM branches, 30% mid LAD narrowing, and a 50%  OM-2 narrowing, EF was 25-30%.  The plan was for medical therapy.  The  patient was discharged home and did well until this morning.  She was in  bed and she developed some indigestion followed by severe substernal  chest pain and dyspnea.  EMS was called.  On the scene, she had  bigeminy.  She was treated with lidocaine and nitroglycerin in the field  and transferred to [facility].  Her history is somewhat difficult to elicit,  but overall she seems to be discomfort-free at the moment.   PAST MEDICAL HISTORY:  Her past medical history is remarkable for  multiple abdominal surgeries.  She apparently had peritonitis in the  90s.  Her last surgery was about a year ago.  There has been no history  of hypertension, diabetes, or dyslipidemia.  She has had no prior  history of heart attack.   SOCIAL HISTORY:  She is a widow.  She lives with her daughter.  She used  to work in a [employer], therapeutic.  She is nonsmoker.   FAMILY HISTORY:  There is no family history of coronary disease.   REVIEW OF SYSTEMS:  She does have a history of a short bowel syndrome  with frequent diarrhea and loose stools.  She has not had any GI  bleeding.   CURRENT MEDICATIONS:  1. Lanoxin 0.125 mg a day.  2. Lisinopril 5 mg a day.  3. BiDil three times a day.  4. Vicodin 1/2 tablet p.r.n.   ALLERGIES:  SHE IS ALLERGIC TO MORPHINE AND CIPRO.   PHYSICAL EXAM:  VITAL SIGNS:  Blood pressure 158/70, pulse 86,  temperature 97, O2 saturation is 90%.  GENERAL:  She is an elderly female in no acute distress.  HEENT: Normocephalic.  Extraocular movements are intact.  Sclerae are  nonicteric.  NECK:  She does have carotid bruits, carotid Dopplers were done last  week, the results are pending.  CHEST:  Reveals diminished breath sounds at the bases.  BACK:  She does have scoliosis.  CARDIAC:  Exam reveals regular rate and rhythm without obvious murmur or  rub.  ABDOMEN:  Reveals midline surgical scar.  EXTREMITIES:  Reveal no edema.  Distal pulses are intact.  NEUROLOGIC:  Exam is grossly intact.  She is awake, alert, oriented, and  cooperative.  SKIN:  Skin is warm and dry.   LABORATORY DATA:  EKG reveals sinus rhythm, PVC, nonspecific ST changes.  Labs:  Sodium 141, potassium 3.5, BUN 7, creatinine 0.7, hemoglobin  13.3, hematocrit 39.   IMPRESSION:  1. Chest pain, question cardiac, possible gastrointestinal component.  2. Cardiomyopathy, apparently nonischemic with recent catheterization      showing mild coronary disease.  3. Obesity.  4. Multiple abdominal surgeries in the past.  5. Premature ventricular contractions and bigeminy this morning,      treated with lidocaine per emergency medical services.   PLAN:  The patient be admitted for 24-hour observation.  She will be  started on a PPI.  Will go ahead and cycle enzymes.  Beta blocker will  be added.      Abelino Derrick, P.A.     ______________________________  Dani Gobble, MD    LKK/MEDQ  D:  05/14/2007  T:  05/14/2007  Job:  [id number]

## 2011-01-24 NOTE — Discharge Summary (Signed)
NAMEKARISHA, MARLIN NO.:  [id number]   MEDICAL RECORD NO.:  [id number]          PATIENT TYPE:  INP   LOCATION:  2037                         FACILITY:  [facility]   PHYSICIAN:  Doylene Canning. Ladona Ridgel, MD    DATE OF BIRTH:  08/16/1927   DATE OF ADMISSION:  05/22/2007  DATE OF DISCHARGE:  05/24/2007                               DISCHARGE SUMMARY   ADDENDUM   This concerns her hypokalemia.  She was admitted to the emergency room  in the midnight of September 9.  She had fever, abdominal tremors and  also hypokalemia.  She was given 40 mEq of potassium.  When she  reappeared September 10, for her ICD in atrial fibrillation with rapid  ventricular rate, it turned out that her potassium was 3.0 and  unfortunately, this has not been replenished.  Therefore, prior to her  discharge, she has been given 40 mEq and then she will take 40 mEq today  at noon, a total of 80 mEq on September 12.  She has a prescription for  potassium chloride 20 mEq daily.  I have talked with Dr. Claire Shown  office and they have kindly set up an appointment for her on Wednesday,  September 17, at 3 p.m. for a basic metabolic panel to make sure that  potassium 20 mEq is sufficient or to make sure it is not too much.      Maple Mirza, PA      Doylene Canning. Ladona Ridgel, MD  Electronically Signed    GM/MEDQ  D:  05/24/2007  T:  05/24/2007  Job:  [id number]   cc:   Dani Gobble, MD  Franchot Heidelberg, M.D.

## 2011-01-24 NOTE — Op Note (Signed)
[name], [name]                ACCOUNT NO.:  [id number]   MEDICAL RECORD NO.:  [id number]          PATIENT TYPE:  OIB   LOCATION:  3709                         FACILITY:  [facility]   PHYSICIAN:  Doylene Canning. Ladona Ridgel, MD    DATE OF BIRTH:  [birth year]   DATE OF PROCEDURE:  DATE OF DISCHARGE:                               OPERATIVE REPORT   PROCEDURE PERFORMED:  ICD lead extraction and insertion of new a  defibrillator lead with ICD pocket.  ICD generator removal and  reinsertion.   INTRODUCTION:  The patient is a very pleasant 77 year old woman with a  history of nonischemic cardiomyopathy, congestive heart failure, who  underwent initial ICD insertion back in September 2008.  She was found  to have loss of R-waves and underwent ICD lead revision in December  2008.  The patient was stable, but who presented to our office 2 days  ago with a defibrillator lead that was neither sensing nor capturing.  She subsequently was admitted today for ICD lead revision.   PROCEDURE:  After informed consent obtained, the patient was taken to  diagnostic EP lab in fasting state.  After usual preparation and  draping, intravenous fentanyl and midazolam was given for sedation.  Lidocaine 30 mL was infiltrated in the left infraclavicular region.  A 7  cm incision was carried out over this region.  Electrocautery was  utilized to dissect down to the fascial plane.  Care was taken not to  enter this ICD pocket.  Fluoroscopy demonstrated that the defibrillator  lead had retracted back in the right atrial SVC junction.  Additional  fluoroscopy demonstrated that the defibrillator lead had was wrapped  around the atrial lead in multiple coils.  After the new defibrillator  lead which was a Medtronic model [id number], Sprint Quattro Secure, active  fixation lead serial # [id number] was advanced into the right ventricle  and mapping carried out and R-waves of 23 mV.  The pacing impedance was  initially elevated at 1300  ohms.  There was a large entry current noted.  The threshold was 1 volt at 0.5 milliseconds.  These satisfactory  parameters lead was secured to sub pectoralis fascia with figure-of-  eight silk suture and the sewn sleeve also secured with silk suture.  At  this point, the ICD pocket was entered with electrocautery.  The  generator was removed.  The lead had been wrapped around the  defibrillator head and the lead had wrapped around itself approximately  20 times and after the leads were freed up and removed from the  generator, they were additionally evaluated.  At this point, a stylet  was advanced into the defibrillator lead.  This helix was retracted back  in the housing and gentle tension was applied to the lead itself.  This  resulted in extraction of the lead without difficulty.  At this point,  the pocket was irrigated very care with kanamycin and the defibrillator  was reconnected to the atrial and new defibrillator lead and placed back  in the subcutaneous pocket.  The generator was secured very tightly with  silk suture.  The pocket was again irrigated with kanamycin and the  incision was closed with 2-0 Vicryl followed by layer of 3-0 Vicryl.   After the patient was more deeply sedated with fentanyl and Versed, VF  was induced with T-wave shock and a 15 joules shock was delivered, which  then terminated VF and restored sinus rhythm.  At this point, the  patient was allowed to awaken, Steri-Strips were applied and a pressure  dressing in place.  The patient was returned to her room in satisfactory  condition.   COMPLICATIONS:  There were no immediate complications.   RESULTS:  Demonstrate successful extraction of an old ICD lead and  insertion of inducible Medtronics Sprint Quattro secured the  defibrillator lead without immediate procedure complications.      Doylene Canning. Ladona Ridgel, MD  Electronically Signed     GWT/MEDQ  D:  03/12/2008  T:  03/13/2008  Job:  [id number]

## 2011-01-24 NOTE — Consult Note (Signed)
NAMEREIGAN, TOLLIVER                ACCOUNT NO.:  [id number]   MEDICAL RECORD NO.:  [id number]          PATIENT TYPE:  INP   LOCATION:  A335                          FACILITY:  [facility]   PHYSICIAN:  Antonieta Iba, MD   DATE OF BIRTH:  Mar 22, 1927   DATE OF CONSULTATION:  DATE OF DISCHARGE:  08/10/2008                                 CONSULTATION   HISTORY OF PRESENT ILLNESS:  Ms. Bortle is a very pleasant 77 year old  woman with a history of nonischemic cardiomyopathy, cardiac  catheterization 2008 showing no significant coronary artery disease,  history of nonsustained VT with an AICD placed, also with history of  hypertension and paroxysmal atrial fibrillation, who presents for  episode of chest pain.   Ms. Wassenaar, per her daughter, states that she was rising from the bed  and she had acute onset of chest pain.  She states that it was very  bad, though she is unable to estimate how long her chest pain  lingered.  Her daughter brought her into the hospital, though gave her  1 nitroglycerin at home, though it is uncertain if this helped improve  her symptoms.  Since she has been in the hospital, she has had no  further episodes of chest discomfort.  Her telemetry shows no  significant abnormality.  She has an A-paced as well as a V-paced  rhythm.  She has been walking in the hallways with no significant  problems.   Last echocardiogram showed an ejection fraction of 45-50% with mild  global hypokinesis and no significant valvular abnormalities.  Dr.  Dessie Coma has been managing her medications and had decreased her  amiodarone to 200 mg once a day and moved her lisinopril to 5 mg once a  day.  She did have an episode of chest pain last year that led to her  stress test and cardiac catheterization, but again this showed no  significant coronary artery disease.  She states that she is due to have  her device interrogated in January 2010.   PAST MEDICAL HISTORY:  1. Significant  for nonischemic cardiomyopathy with ejection fraction      of 45-50%, which has improved from 30-35%.  2. Hypertension, nonsustained VT with an AICD placed, history of      paroxysmal atrial fibrillation, not on Coumadin as she is fall      risk.   CURRENT MEDICATIONS:  1. Aspirin 325 mg once a day.  2. Amiodarone 200 mg daily.  3. BiDil 20/37.5 mg t.i.d.  4. Potassium 20 mEq daily.  5. Omeprazole 20 mg daily.  6. Carvedilol 25 mg b.i.d.  7. Lisinopril 5 mg daily.  8. Namenda.  9. Vitamin B12 shots monthly.   REVIEW OF SYSTEMS:  Positive for chest pain, though not over the past 24-  48 hours.  Otherwise, she states that all other review of systems is  negative.   ALLERGIES:  MORPHINE, CIPRO, and BETA-BLOCKERS.   PHYSICAL EXAMINATION:  GENERAL:  Afebrile.  VITALS:  Blood pressure 130-140/60-70 with heart rate in the 70s and  regular.  Respirations 18,  sating greater than 95% on room air.  HEENT:  Benign.  NECK:  Supple.  No JVD or carotid bruits.  HEART:  Sounds are regular with normal S1 and S2 and no murmurs  appreciated.  LUNGS:  Clear to auscultation with no wheezes or rales.  ABDOMEN:  Benign.  EXTREMITY:  She has no significant lower extremity edema.  Pulses are  equal and symmetrical in her upper and lower extremities.   EKG from August 09, 2008 is showing normal sinus rhythm with a rate of  67 beats per minute with no significant ST or T wave changes.   LABORATORY DATA:  Today shows sodium 141, potassium 3.9, creatinine of  1.07, and a glucose of 98.  TSH of 3.8.  CK of 59, MB of 1.5, and  troponin of 0.02.  She has had 3 sets of negative enzymes.  BNP of 86.   In summary, Ms. Joi Leyva is a very pleasant 77 year old woman with a  history of nonischemic cardiomyopathy, nonsustained ventricular  tachycardia with an AICD placed and also with history of paroxysmal  atrial fibrillation, who is not a Coumadin candidate, history of  hypertension and dementia, who  presents for an episode of chest pain.   Ms. Pai has ruled out for an myocardial infarction.  She has no  significant EKG changes.  She has had no events on telemetry.  Her blood  pressure and heart rate are under excellent control.  We have suggested  that she could be discharged home with close followup with our clinic  itself at [facility].  Also, we have suggested that she contact Dr.  Ladona Ridgel to see if she can have defibrillator checked at an earlier date  than her regular scheduled appointment in January 2010.  No further  intervention as needed at this time and no further testing.  We will  continue her medications at their current doses, given her excellent  blood pressure and heart rate control.  I have talked with her daughter  and she is in agreement, and if [facility] feels that she is ready to go  home, she can be discharged today.      Antonieta Iba, MD  Electronically Signed     TJG/MEDQ  D:  08/10/2008  T:  08/11/2008  Job:  [id number]

## 2011-01-24 NOTE — Discharge Summary (Signed)
[name], [name]                ACCOUNT NO.:  [id number]   MEDICAL RECORD NO.:  [id number]          PATIENT TYPE:  OBV   LOCATION:  4731                         FACILITY:  [facility]   PHYSICIAN:  Dani Gobble, MD       DATE OF BIRTH:  September 13, 1926   DATE OF ADMISSION:  05/14/2007  DATE OF DISCHARGE:                               DISCHARGE SUMMARY   DISCHARGE DIAGNOSES:  1. Chest pain, resolved, with negative cardiac enzymes and recent      heart catheterization May 01, 2007, repeat, with nonobstructive      coronary disease, posterior descending coronary artery with 40-50%      narrowing, second diagonal 50% ostial lesion, and left anterior      descending artery with 30% stenosis.  Ejection fraction was found      to be 25-30% with global hypokinesis.  2. Wenckebach type 1 atrioventricular block secondary to beta blocker      and Lanoxin.  3. Hypertension, controlled.  4. Nonsustained ventricular tachycardia with nonischemic      cardiomyopathy.  5. Elevated D-dimer with negative pulmonary embolus.  6. Right middle lobe lung nodule, has been present since June 2005 and      is stable, but followup with primary care as primary care has been      following.  7. Gastroesophageal reflux disease treated with Prilosec.   HISTORY OF PRESENT ILLNESS:  A 77 year old female medical patient of Dr.  Franchot Heidelberg in [place] that had chest pain over the summer,  underwent cardiac catheterization with nonobstructive coronary disease  but EF was 25-30%.  The plan was for medical therapy.  She was  discharged home and was readmitted May 14, 2007, after developing  indigestion followed by severe substernal pain and dyspnea.  EMS was  called.  She was treated with lidocaine for frequent PVCs, bigeminy, and  nitroglycerin.   PAST MEDICAL HISTORY:  Multiple abdominal surgeries, cardiac as stated,  peritonitis in the 1990s.  No hypertension, diabetes, or dyslipidemia.   SOCIAL  HISTORY, FAMILY HISTORY, REVIEW OF SYSTEMS:  See history and  physical.   OUTPATIENT MEDICATIONS:  Had been Lanoxin, lisinopril, BiDil, and  Vicodin which she only takes rarely.   ALLERGIES:  Allergic to MORPHINE and CIPRO and was felt to be intolerant  to BETA BLOCKERS, certainly had bradycardia with beta blockers.   DISCHARGE MEDICATIONS:  1. Lisinopril 5 mg daily.  2. BiDil 20/37.5 three times a day.  3. Prilosec 20 mg daily.   DISCHARGE INSTRUCTIONS:  1. Low-sodium heart-healthy diet.  2. No driving for now.  3. Follow up with Dr. Domingo Sep May 31, 2007.  4. Follow up with Dr. Ladona Ridgel by returning to [facility] Wednesday,      September 10 to short-stay C at noon.  Please, nothing to eat after      midnight Tuesday, September 9.  She may drink clear liquids until      10 a.m. Wednesday, September 10.  You may take your morning      medications.  At that time on September 10, she  will receive an ICD      by Dr. Ladona Ridgel.   PHYSICAL EXAMINATION AT DISCHARGE:  Blood pressure 135/63, pulse 70,  respirations 18, temperature 98.5, oxygen saturation 95%.  HEART:  Regular rate and rhythm.  LUNGS:  Clear.  ABDOMEN:  Positive bowel sounds.  EXTREMITIES:  Without edema.   LABORATORY DATA:  Hemoglobin 12.1, hematocrit 36.4, wbc's 5.6, platelets  278 - these remained stable.  Chemistry:  Sodium 141.  Potassium 3.5 and  3.2.  She was replaced, and at discharge sodium 142, potassium 4.4,  chloride 109, CO2 29, BUN 1, creatinine 0.66, glucose 103.  Pro time  13.2, INR 1, PTT 32.  LFTs were normal.  Cardiac enzymes were negative.  CK ranged 67, 71, 81; MB of 2.6, 2.8, 2.6; troponin I 0.01 to 0.03.  Total cholesterol 127, LDL 67, HDL 38, triglycerides 108.  Calcium 8.8,  magnesium 1.9.  BNP was 203.  D-dimer was 3.35.   TSH 2.629.  Digoxin level was 1.2.   RADIOLOGY:  Portable chest:  Stable cardiomegaly, no active  cardiopulmonary disease with previous posteromedial left upper  lobe and  right middle lobe CT opacities not appreciated on portable chest x-ray.  PA and lateral:  Mild cardiac enlargement, stable; chronic lung changes  with scarring; right lower lobe nodule and pleuritic calcifications.  CT  of chest was negative for pulmonary embolus; small hiatal hernia, air-  fluid level in the distal thoracic esophagus, cardiomegaly, right middle  lobe lung nodule was similar to CT abdomen February 19, 2004, and therefore  consistent with benign process; opacity in the left posterior lung felt  to represent rounded atelectasis; consider followup chest CT in 3  months; small hiatal hernia with probable esophageal dysmotility.   EKG:  Initial:  Sinus rhythm, first degree AV block, PACs, PVCs, some  questionable lateral ischemia.  Followup later that day:  Mobitz 2.  The  patient continued with Mobitz 1 and 2 episodically.  Bystolic was  discontinued.   Please note, on rhythm strips, in addition to her frequent PVCs and  bigeminy, she did have nonsustained ventricular tachycardia.   HOSPITAL COURSE:  The patient was admitted with chest pain thought to be  GI, was ruled out.  D-dimer was elevated.  CT of the chest was done,  again ruled out PE, and previous nodule was stable and will follow up  with primary for her nodule.  She was stable by the next morning except  for her bradycardia associated with Mobitz 1 and 2, and that was on  Bystolic.  Bystolic was discontinued, Lanoxin was discontinue, and the  patient was monitored, but with her EF of 25-30% and her frequent  bigeminy, PVCs, and nonsustained ventricular tachycardia, she is at risk  for sudden death so plans for ICD are to be done next week with Dr.  Ladona Ridgel after EP consult was obtained.  By May 17, 2007, the patient  was stable without complaints and ready for discharge home, and she will  follow up as previously instructed.      Darcella Gasman. Annie Paras, N.P.    ______________________________  Dani Gobble, MD    LRI/MEDQ  D:  05/17/2007  T:  05/17/2007  Job:  [id number]   cc:   Franchot Heidelberg, M.D.  Doylene Canning. Ladona Ridgel, MD

## 2011-01-24 NOTE — Discharge Summary (Signed)
[name], [name]                ACCOUNT NO.:  [id number]   MEDICAL RECORD NO.:  [id number]          PATIENT TYPE:  INP   LOCATION:  A307                          FACILITY:  [facility]   PHYSICIAN:  Dorris Singh, DO    DATE OF BIRTH:  10/22/1926   DATE OF ADMISSION:  08/20/2008  DATE OF DISCHARGE:  12/11/2009LH                               DISCHARGE SUMMARY   ADMISSION DIAGNOSIS:  Chest pain and shortness of breath.   DISCHARGE DIAGNOSES:  1. Chest pain.  2. History of coronary artery disease.  3. Hypokalemia.  4. Atrial fibrillation.  5. Congestive heart failure with ejection fraction of 25%.  6. Dementia.  7. History of nonsustained ventricular tachycardia with automatic      implantable cardioverter defibrillator placement.   TESTING:  Testing that was done includes portable chest x-ray on  December 10, shows mild cardiac enlargement with AICD, left bibasilar  scarring and pleural calcification.   CONSULTS:  [facility] was consulted.   Her H&P was done by Dr. Elige Radon.  To summarize, the patient was admitted  for chest pain and shortness of breath.  [facility] was  consulted, who saw her.  There was some concern as to whether or not  this was truly cardiac pain.  However, with the patient's history and  her AICD, they decided that it was appropriate to go ahead and check it.  They consulted [facility] who came out today and found that everything was  fine.  Dr Lucien Mons and Dr. Alanda Amass did see her these two consecutive days  and said that they felt that it was okay for her to go home.  At this  point in time the patient has not had any chest pain.  We will go ahead  and discharge her to home.  Her hypokalemia did resolve itself and her  shortness of breath also resolved.   She will be sent home on her current list of medications which include:   1. Amiodarone 100 mg 2 tablets daily.  2. Aspirin 325 mg one daily.  3. Bidil 20/37.5 one tablet 3 times a  day.  4. Potassium chloride 20 mEq once daily.  5. Omeprazole 20 mg 1 p.o. daily.  6. Coreg 6.25 mg 2  times a day.  7. Lisinopril 5 mg once a day.  8. Namenda no dose given.  9. Sanctura 60 mg 1 capsule at night.   DISCHARGE INSTRUCTIONS:  To increase her activity slowly, to resume a  cardiac diet, to follow up with [facility] next week and if  she has any symptoms, to return to the emergency room.      Dorris Singh, DO  Electronically Signed     CB/MEDQ  D:  08/21/2008  T:  08/21/2008  Job:  [id number]

## 2011-01-24 NOTE — Discharge Summary (Signed)
[name], [name]                ACCOUNT NO.:  [id number]   MEDICAL RECORD NO.:  [id number]          PATIENT TYPE:  INP   LOCATION:  3709                         FACILITY:  [facility]   PHYSICIAN:  Doylene Canning. Ladona Ridgel, MD    DATE OF BIRTH:  1927/08/05   DATE OF ADMISSION:  03/12/2008  DATE OF DISCHARGE:  03/13/2008                               DISCHARGE SUMMARY   PROCEDURES:  Implantable cardioverter-defibrillator lead extraction with  insertion of a new defibrillator lead with implantable cardioverter-  defibrillator generator removal and reinsertion.   PRIMARY FINAL DISCHARGE DIAGNOSIS:  Defibrillator lead dysfunction.   SECONDARY DIAGNOSES:  1. Nonischemic cardiomyopathy with an ejection fraction of 55% by      echocardiogram in February 2008.  2. Mild dementia.  3. Hypertension.  4. Nonsustained ventricular tachycardia status post [employer]onic      implantable cardioverter-defibrillator.  5. History of left ventricular dysfunction with an ejection fraction      of 25-30% by cardiac catheterization in August 2008.  6. Nonobstructive coronary artery disease.  Cardiac catheterization in      August 2008 showing D2 50%, LAD 30%, PDA 50%.  7. 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  8. History of atrioventricular nodal Wenckebach block.  9. Allergy or intolerance to MORPHINE and CIPRO.   TIME OF DISCHARGE:  35 minutes.   HOSPITAL COURSE:  [name] is a 77 year old female with a history of  VT who has a Medtronic ICD.  She was evaluated by Dr. Ladona Ridgel in the  office and her R-waves measured only 1.6 with a threshold greater than  7.5 V.  Her impedance was 330 ohms.  Dr. Ladona Ridgel felt that there were no  programming changes, able to make the defibrillator lead work  satisfactorily, and he recommended the lead change out.  She came to the  hospital for this on March 12, 2008.   She had her ICD lead extracted and a Medtronic lead inserted.  Her ICD  generator was removed and  reinserted.  There were no immediate  complications and she tolerated the procedure well.   A chest x-ray the day after the procedure showed new dual lead  transvenous pacemaker in appropriate position with no pneumothorax.  Dr.  Ladona Ridgel started her on Keflex, but felt that no further inpatient workup  was indicated if her ICD check reveals appropriate functioning.  She is  therefore tentatively considered stable for discharge with close  outpatient followup.   DISCHARGE INSTRUCTIONS:  1. Her activity level is to be increased gradually.  2. She is to do no lifting for 6 weeks with her left arm.  3. She is to increase her activities slowly.  4. She is to stick to a low-sodium diet.  5. She is to follow up with Dr. Ladona Ridgel and our office will call her.  6. She is to follow up with Dr. Erby Pian and Dr. Domingo Sep as      scheduled.   DISCHARGE MEDICATIONS:  1. Keflex 500 mg q.8 h. x5 days.  2. BiDil 20/37.5 t.i.d.  3. Potassium 20 mEq daily.  4. Amiodarone 200 mg daily.  5. Coreg 25 mg b.i.d.  6. Aspirin 325 mg daily.  7. Lisinopril 2.5 mg daily.  8. Sanctura XL 60 mg daily.  9. Omeprazole 20 mg daily.  10.Namenda 10 mg b.i.d.      Theodore Demark, PA-C      Doylene Canning. Ladona Ridgel, MD  Electronically Signed    RB/MEDQ  D:  03/13/2008  T:  03/13/2008  Job:  [id number]   cc:   Dani Gobble, MD  Franchot Heidelberg, M.D.

## 2011-01-24 NOTE — Discharge Summary (Signed)
[name], [name]                ACCOUNT NO.:  [id number]   MEDICAL RECORD NO.:  [id number]          PATIENT TYPE:  INP   LOCATION:  3742                         FACILITY:  [facility]   PHYSICIAN:  Doylene Canning. Ladona Ridgel, MD    DATE OF BIRTH:  10/07/1926   DATE OF ADMISSION:  05/30/2007  DATE OF DISCHARGE:  05/31/2007                               DISCHARGE SUMMARY   FINAL DIAGNOSES:  1. Admitted in the early morning hours of September 19th; short of      breath, weakness, palpitation.  No chest pain.  The patient had      ventricular bigeminy on emergency room electrocardiogram.  2. Troponin.  Studies are less than 0.05, then 0.02.  3. Chest x-ray shows the lungs are clear.  No focal air space disease.      Scarring at the left lung base.  4. Elevated D-dimer of 2.6, but no pulmonary embolism by CT of the      chest.  5. The patient improved after resting in the hospital for a 10-hour.   SECONDARY DIAGNOSES:  1. History of symptomatic bradycardia/nonischemic cardiomyopathy/[place]      [place] Heart Association class III congestive heart      failure/nonsustained ventricular tachycardia.  2. Recent implant of dual-chamber cardioverter defibrillator, [employer], current model, May 22, 2007.  3. On arrival for cardioverter defibrillator September 10th, the      patient was found to be in A-fib with dyspnea.  This spontaneously      converted.  4. History of nonischemic cardiomyopathy.  Left heart catheterization      during hospitalization May 14, 2007 to May 17, 2007.      Ejection fraction 25% to 30%.  5. Gastroesophageal reflux disease.  6. Mild dementia.  7. History of an nonsustained ventricular tachycardia.  8. Hospitalized September 9th for abdominal tremors and hypokalemia.      The patient received normal saline and potassium supplement, was      discharged 9 hours later without tremors at 9:40 in the morning on      September 9th.   Of note, the patient is on  amiodarone 200 mg 2 tablets daily for her  atrial fibrillation.   BRIEF HISTORY:  [name] is a 77 year old female.  Past  medical/surgical history includes nonischemic cardiomyopathy, a 2:1  heart block, NSVT.  She had a defibrillator implanted September 10th.  She is presenting after 12 hours complaining of chest pounding.  The  patient denies chest pressure, dizziness, syncope, paroxysmal nocturnal  dyspnea, orthopnea or edema.  The patient does have some shortness of  breath this evening associated with the pounding in her chest.  The  patient called [facility] and was advised to come to the  emergency room.   HOSPITAL COURSE:  The patient arrived in the emergency room.  Her  electrocardiogram showed ventricular bigeminy.  She was admitted.  Troponin I were cycled.  They are negative x2.  She had elevated D-  dimer.  CT scan of the chest was negative for pulmonary  embolism.  It  turns out during her hospitalization September 2nd, she also had  elevated D-dimer with a CT of the chest which was negative for pulmonary  embolism.  Chest x-ray showed that her lungs were clear.  There was some  scarring at the left base.  The patient is feeling much better after 10  hours here at the hospital.  She is continuing on her regular  medications.  She is in sinus rhythm.  No interventions this admission.  Hemoglobin was 10, hematocrit 30.  Complete blood count this admission:  hemoglobin 10.2 hematocrit of 30.4, white cells are 8.3, platelets are  266.  Serum electrolytes on admission:  sodium 135, potassium 3.9,  chloride 104, carbonate 24, BUN is 8, creatinine 0.85, glucose is 78,  magnesium is 1.8.  D-dimer, as mentioned before, is 2.6.  BNP was 363.   MEDICATIONS AT DISCHARGE:  Continue as before:  1. Amiodarone 200 mg 2 tablets daily.  This a new medication.  2. Coreg 3.125 mg twice daily.  3. Prilosec 20 mg daily.  4. BiDil 20/37.5 three times daily.  5. Lisinopril 5 mg  daily.  6. Potassium chloride 20 mEq daily.   FOLLOWUP APPOINTMENTS:   [facility], [address];  Wednesday, October 1st, at 9, and she will see Dr. Ladona Ridgel December 10th  at 4.  She sees Dr. Domingo Sep at the [place] office of [facility] Friday, October 3rd, at 5.      Maple Mirza, PA      Doylene Canning. Ladona Ridgel, MD  Electronically Signed    GM/MEDQ  D:  05/31/2007  T:  06/01/2007  Job:  [id number]   cc:   Doylene Canning. Ladona Ridgel, MD  Dani Gobble, MD  Dr. Fredirick Lathe

## 2011-01-24 NOTE — Op Note (Signed)
[name], [name]                ACCOUNT NO.:  [id number]   MEDICAL RECORD NO.:  [id number]          PATIENT TYPE:  INP   LOCATION:  3738                         FACILITY:  [facility]   PHYSICIAN:  Doylene Canning. Ladona Ridgel, MD    DATE OF BIRTH:  [birth year]   DATE OF PROCEDURE:  08/27/2007  DATE OF DISCHARGE:                               OPERATIVE REPORT   PROCEDURE PERFORMED:  ICD lead removal (extraction) and insertion of a  new defibrillation lead with defibrillation threshold testing.   INTRODUCTION:  The patient is a 77 year old woman with a nonischemic  cardiomyopathy and EF of 25% and 2:1 heart block and syncope who  underwent ICD implantation back in September.  She was in her usual  state of health and presented to the office last week where she was  found to have non-capture of her ventricular pacing lead with a  reduction in R-waves.  It was noted on review of her history that her R-  waves dropped dramatically from the 8-9 mV range to the 1 mV range about  3-4 weeks ago.  She is now referred for hopeful ICD lead revision and if  not possible, removal and insertion of a new defibrillator lead.   PROCEDURE:  After informed consent was obtained, the patient was taken  to the diagnostic EP lab in a fasting state.  After the usual  preparation and draping, intravenous fentanyl and midazolam was given  for sedation.  30 mL lidocaine was infiltrated into the old ICD  insertion site.  A 7 cm incision was carried out over this region and  electrocautery was utilized to dissect down to the ICD pocket.  The  generator was removed.  It should be noted that the generator, itself,  appeared twisted and the leads were quite twisted inside the  subcutaneous pocket.  After both the atrial and defibrillation leads  were removed from the defibrillator, the defibrillator generator was  placed in a bowl of kanamycin.  Electrocautery was utilized to free up  the defibrillation lead and sewing sleeve.   Having accomplished this, a  stylet was advanced into the defibrillator lead and fluoroscopy  demonstrated that, in doing so, the lead had retracted back into the  IVC.  At this point, the helix of the defibrillator lead was retracted  back into its housing but it should be noted that it took approximately  30 turns to allow the helix to retract into its lead housing.  A curved  stylet was then advanced into the defibrillator lead which was utilized  to advance the lead out into the right ventricle.  On the RV septum, the  lead was placed and attempts to actively fix the lead were made more  difficult by multiple screws of the helix which eventually was exposed  and screwed into the RV septum.  Unfortunately at this location, the R-  waves were only 3-4 mV and attempts to retract the lead helix back into  the housing of the defibrillator body were unsuccessful.  At this point,  because of the dysfunction of the lead helix  on the defibrillator lead,  the defibrillator lead was removed and a new lead was prepared to be  placed.  Having accomplished access by puncture of the subclavian vein,  the lead was attempted to be advanced into the RV.  It should be noted,  however, that at the SVC brachial cephalic vein insertion site, there  was a fibrous adhesion and the initial attempts to advance the lead were  unsuccessful.  At this point, the lead was removed from the subclavian  vein and a second puncture was carried out.  This time a long sheath was  advanced over a J wire across the stenosis area that was previously  mentioned and the St. Jude lead was advanced over this long guiding  catheter into the right ventricle.  Mapping was carried out at the final  site on the RV apex.  The R-waves measured approximately 12 mV and the  pacing impedance was 900 ohms.  The threshold 0.5 volts at 0.5  milliseconds.  There is a large injury current present.  10 volts pacing  did not stimulate the  diaphragm.  With these satisfactory parameters,  the lead was secured to the pectoralis fascia with a figure-of-eight  silk suture and the sewing sleeve also secured a silk suture.  The  previously implanted defibrillator was connected to the atrial and new  defibrillation leads and placed back in the subcutaneous pocket.  The  generator was secured with silk suture.  Kanamycin was utilized to  irrigate the pocket and electrocautery utilized to assure hemostasis.  Defibrillation threshold testing was then carried out.   After the patient was more deeply sedated with fentanyl and Versed, VF  was induced with a T-wave shock.  A 15 joules shock was delivered which  terminated VF and restored sinus rhythm.  At this point, no additional  defibrillation threshold testing was carried out and the incision closed  with a layer of 2-0 Vicryl followed by a layer of 3-0 Vicryl followed by  a layer of 4-0 Vicryl.  Benzoin was painted on the skin, Steri-Strips  were applied, a pressure dressing placed, and the patient was returned  to her room in satisfactory condition.   COMPLICATIONS:  There were no immediate procedure complications.   RESULTS:  This demonstrates successful implantation of a St. Jude single  chamber defibrillator lead following extraction of the old lead in a  patient with a nonischemic cardiomyopathy, congestive heart failure, and  EF of 25% with syncope and 2:1 heart block.      Doylene Canning. Ladona Ridgel, MD  Electronically Signed     GWT/MEDQ  D:  08/27/2007  T:  08/27/2007  Job:  [id number]

## 2011-01-24 NOTE — H&P (Signed)
[name], [name]                ACCOUNT NO.:  [id number]   MEDICAL RECORD NO.:  [id number]          PATIENT TYPE:  INP   LOCATION:  3738                         FACILITY:  [facility]   PHYSICIAN:  Doylene Canning. Ladona Ridgel, MD    DATE OF BIRTH:  1927/07/28   DATE OF ADMISSION:  08/25/2007  DATE OF DISCHARGE:                              HISTORY & PHYSICAL   ADMITTING DIAGNOSIS:  Syncope in the setting of chest pain.   HISTORY OF PRESENT ILLNESS:  The patient is a very pleasant elderly  woman who has a history of nonischemic cardiomyopathy, congestive heart  failure and intermittent 2:1 heart block back in Jan 17, 2007.  The  patient had catheterization at that hospitalization.  She had no  obstructive coronary artery disease.  She was found to have fairly  severe LV dysfunction, and she ultimately underwent ICD implantation.   The patient was in her usual state of health until presenting to the  hospital today.  The patient's daughter, who was with her, states that  she was using the bathroom and had chest pain, slumped over and passed  out.  The patient gradually awakened after the paramedics arrived and  placed some oxygen on her.  She is now admitted for additional  evaluation.   PAST MEDICAL HISTORY:  1. Class II-III heart failure symptoms.  She has an EF of 25%.  2. She has mild dementia with loss of memory.  3. She has a history of hypertension.  4. She has a history of nonsustained ventricular tachycardia as well.   FAMILY HISTORY:  Noncontributory.   SOCIAL HISTORY:  The patient lives with her daughter.  She denies  tobacco or ethanol abuse.   REVIEW OF SYSTEMS:  Is otherwise negative, except as noted in the HPI.  She denies chest pain presently.   PHYSICAL EXAMINATION:  Notable for being a pleasant 77 year old woman in  no distress.  Blood pressure is 160/80, pulse was 55 and regular,  respirations were 18, temperature was 98.  HEENT: Normocephalic and atraumatic.  Pupils are  equal and round.  Oropharynx was moist.  Sclerae anicteric.  NECK:  Revealed no jugular distention.  There was no thyromegaly.  Trachea was midline.  Carotids 2+ and symmetric.  LUNGS:  Clear bilaterally to auscultation.  No wheezes, rales or rhonchi  were present.  There was no increased work of breathing.  CARDIOVASCULAR:  Regular rate and rhythm, with normal S1 and S2.  There  were no murmurs, rubs or gallops.  The PMI was enlarged and not  displaced.  ABDOMINAL:  Soft, nontender and nondistended.  There was no  organomegaly.  Bowel sounds were present.  There was no rebound or  guarding.  EXTREMITIES:  Demonstrate no cyanosis, clubbing or edema.  Pulses were  2+ and symmetric.  NEUROLOGIC:  Alert and oriented x2.  Cranial nerves were intact.  Strength was 4+/5 and symmetric.   EKG:  Demonstrates sinus rhythm with normal axis and intervals.   IMPRESSION:  Labs are all pending at time of this dictation.   IMPRESSION:  1. Syncope.  2. Chest pain with a negative catheterization in the past; but      symptoms are somewhat concerning for angina.  3. History of ventricular lead dysfunction.  4. Cardiomyopathy.  5. Intermittent 2:1 heart block, but now not present.   DISCUSSION:  Plan is to admit the patient to the hospital, obtain serial  cardiac enzymes and keep her on telemetry.  We will try to revise her  defibrillator lead this hospitalization, and sooner than later.  Will  check a 2-D echocardiogram.      Doylene Canning. Ladona Ridgel, MD  Electronically Signed     GWT/MEDQ  D:  08/25/2007  T:  08/26/2007  Job:  [id number]

## 2011-01-24 NOTE — H&P (Signed)
NAMEREED, EIFERT                ACCOUNT NO.:  [id number]   MEDICAL RECORD NO.:  [id number]          PATIENT TYPE:  INP   LOCATION:  A307                          FACILITY:  [facility]   PHYSICIAN:  Dorris Singh, DO    DATE OF BIRTH:  [birth year]   DATE OF ADMISSION:  08/20/2008  DATE OF DISCHARGE:  LH                              HISTORY & PHYSICAL   The patient is a 77 year old Caucasian female who presented to the [facility] Emergency Room with a chief complaint of shortness of breath and  chest pain.  She states that this morning she was having severe  shortness of breath and she noticed that her AICD monitor was alarming  at that time.  The ER doctor called Dr. Lubertha Basque office to see if they  had gotten any alarms regarding her AICD and they stated they had not.  The patient then continued to be short of breath and decided to come in  to be evaluated.  Shortness of breath started today, nothing relieved it  and nothing made it better.   PAST MEDICAL HISTORY:  Significant for:  1. Atrial fib.  2. Hypertension.  3. Congestive heart failure with ejection fraction of approximately      25%.  4. Dementia.  5. History of nonsustained ventricular tachycardia.   FAMILY HISTORY:  Noncontributory.   SOCIAL HISTORY:  No history of tobacco, alcohol or illicit drugs.   SURGICAL HISTORY:  She has had pacemaker placement.   ALLERGIES:  To MORPHINE and CIPRO.   REVIEW OF SYSTEMS:  Otherwise negative except for HPI of her chest pain  and shortness of breath.   HOME MEDICATIONS:  Are as follows:  1. Amiodarone 100 mg, 2 tablets.  2. BiDil 20/37.5 1 tablets p.o. three times day.  3. Potassium chloride 20 mEq once a day.  4. Omeprazole 20 mg once a day.  5. Coreg 6.25 mg twice a day.  6. Lisinopril no dose given once a day.  7. Namenda no dose given as directed.  8. Sanctura 60 mg 1 capsule nightly.   VITALS:  Temperature 97.7, respirations 24, pulse rate 74, blood  pressure  134/66.  GENERAL:  The patient is a 77 year old Caucasian female who is well-  developed, well-nourished in no acute distress.  Head is normocephalic, atraumatic.  Eyes are PERRL.  EOMI.  Turbinates  are moist and there is no erythema or exudate in the mouth.  NECK:  Soft, supple.  No lymphadenopathy noted.  HEART:  Regular rate and rhythm.  No murmurs, rubs or gallops.  LUNGS: Clear to auscultation bilaterally, no rales, wheezes or rhonchi.  ABDOMEN: Soft, nontender, nondistended.  No organomegaly noted.  Bowel  sounds x4 elicited.  EXTREMITIES: No edema, cyanosis or ecchymosis noted.  Pulses x4.  NEUROLOGIC:  Cranial nerves II-XII are grossly intact.  Good sensation  and motor of all extremities and the patient is appropriate with  questions.  SKIN:  Good turgor, good texture with varicose veins in lower  extremities.   Her EKG demonstrated sinus premature ventricular contractions and  nonspecific ST  wave changes with no significant change from 08/08/2008.  Her white count of 6.8, hemoglobin 13.5, hematocrit 41.1, platelet count  of 253.  Sodium is 138, potassium 3.1, chloride 104, carbon dioxide 23,  glucose 108, BUN 12 and creatinine 1.03.  Cardiac markers are within  normal limits.  BMP is 81.8.  Chest x-ray findings:  Mild cardiac  enlargement with stable left AICD leads, mildly torturous thoracic  aorta, pulmonary vascularity normal.  Scarring at left base appears  stable with stable calcifications at left diaphragm, no acute  infiltrate, effusion or pneumothorax, bony demineralization.   ASSESSMENT/PLAN:  1. Shortness of breath.  2. Chest pain free.  3. Hypokalemia.   1. We will admit the patient to the service of [facility] and      observation.  2. We will consult [facility] to see her.  3. We will monitor her cardiac enzymes as well as EKG in the morning      and chest x-ray and we will follow labs.  4. We will do deep venous thrombus and gastrointestinal  prophylaxis.  5. We will have medications available to her if she does have chest      pain as well.  6. We will continue to monitor and change therapy as necessary.      Dorris Singh, DO  Electronically Signed     CB/MEDQ  D:  08/20/2008  T:  08/20/2008  Job:  [id number]

## 2011-01-24 NOTE — Discharge Summary (Signed)
NAMETRIVA, HUEBER                ACCOUNT NO.:  [id number]   MEDICAL RECORD NO.:  [id number]          PATIENT TYPE:  INP   LOCATION:  3742                         FACILITY:  [facility]   PHYSICIAN:  Doylene Canning. Ladona Ridgel, MD    DATE OF BIRTH:  [birth year]   DATE OF ADMISSION:  05/30/2007  DATE OF DISCHARGE:  05/31/2007                               DISCHARGE SUMMARY   ADDENDUM:  This just concerns an addendum; I had incorrectly stated that  her Coreg going home was 3.125 mg twice daily.  This has been advanced  this admission.  She was admitted September 19 and discharging September  19, and has been advanced to 6.25 mg twice daily.  This is the only  medication change.      Maple Mirza, PA      Doylene Canning. Ladona Ridgel, MD  Electronically Signed    GM/MEDQ  D:  05/31/2007  T:  06/01/2007  Job:  [id number]   cc:   Dani Gobble, MD  Franchot Heidelberg, M.D.

## 2011-01-24 NOTE — Consult Note (Signed)
[name], [name]                ACCOUNT NO.:  [id number]   MEDICAL RECORD NO.:  [id number]          PATIENT TYPE:  OBV   LOCATION:  4731                         FACILITY:  [facility]   PHYSICIAN:  Doylene Canning. Ladona Ridgel, MD    DATE OF BIRTH:  July 03, 1927   DATE OF CONSULTATION:  05/16/2007  DATE OF DISCHARGE:                                 CONSULTATION   REFERRING PHYSICIAN:  Dani Gobble, MD   INDICATIONS FOR CONSULTATION:  Evaluation of nonsustained VT in the  setting of a nonischemic cardiomyopathy, congestive heart failure, and  AV Wenckebach block.   HISTORY OF PRESENT ILLNESS:  The patient is a 77 year old woman with  known nonischemic cardiomyopathy.  She underwent catheterization back in  August of this year.  Prior to this she had had known LV dysfunction  with an echo EF of 30%.  The patient was in her usual state of health  until the morning of admission when she developed indigestion and  substernal chest tightness, and EMS was called.  She was found to be in  sinus rhythm with ventricular bigeminy and was treated initially with  lidocaine and nitroglycerin.  She was transferred to the hospital where  she ruled out for MI.  On cardiac monitoring she had periods of AV  Wenckebach block as well as intermittent periods of 2:1 heart block.  Beta blockers and digoxin were discontinued.  The patient is now  referred for additional evaluation.  Overall she feels improved.  She  has not had syncope.   PAST MEDICAL HISTORY:  Notable for history of abdominal surgery in the  past, peritonitis in the 1990s.  She has a history of atypical chest  pain in the past.   MEDICATIONS:  BiDil, lisinopril, Lanoxin, and metoprolol.   ALLERGIES:  She has a history of allergy to MORPHINE and CIPRO.   SOCIAL HISTORY:  The patient denies tobacco or ethanol abuse.   FAMILY HISTORY:  Noncontributory.   REVIEW OF SYSTEMS:  Notable for fatigue, weakness at times, and dyspnea  with exertion.  However,  she is still able to work on a regular basis.  Rest of Review of Systems was negative except that she wears glasses for  visual acuity.   PHYSICAL EXAMINATION:  GENERAL:  She is a pleasant elderly woman in no  acute distress.  VITAL SIGNS:  Blood pressure 150/80, pulse was 80 and regular,  respirations 16, temperature 98.  HEENT:  Normocephalic, atraumatic.  Pupils equal and round.  Oropharynx  is moist.  Sclerae anicteric.  NECK:  Revealed no jugular venous distention.  There is no thyromegaly.  Trachea is midline.  Carotids 2+ and symmetric.  LUNGS:  Clear bilaterally to auscultation.  No wheezes, rales or  rhonchi.  There is no increased work of breathing.  CARDIOVASCULAR:  Regular rate and rhythm with normal S1, S2.  The PMI was enlarged and  laterally displaced.  There is no S3 or S4 or gallop appreciated.  ABDOMEN:  Soft, nontender, nondistended.  There is no organomegaly.  Bowel sounds are present.  There is no rebound or  guarding.  EXTREMITIES:  Demonstrated no cyanosis, clubbing or edema.  Pulses were  2+ and symmetric.  NEUROLOGIC:  Alert and oriented x3.  Cranial nerves  are intact.  Strength 5/5 and symmetric.   EKG demonstrates sinus rhythm with AV Wenckebach block.  Also  demonstrates periods of 2:1 heart block.  Telemetry demonstrates  nonsustained VT.   IMPRESSION:  1. Nonischemic cardiomyopathy.  2. Congestive heart failure class II.  3. Nonsustained ventricular tachycardia.  4. Periods of AV Wenckebach block with 2:1 heart block.   DISCUSSION:  I have discussed the treatment options with the patient and  her son in detail.  The risks, benefits, goals, and expectations of a  prophylactic defibrillator implanted have been discussed, and they would  like to proceed with this.  I would recommend discharge from the  hospital and have her readmitted in the next week or two as our schedule  permits to proceed with this device implantation.      Doylene Canning. Ladona Ridgel,  MD  Electronically Signed     GWT/MEDQ  D:  05/17/2007  T:  05/17/2007  Job:  [id number]   cc:   Dani Gobble, MD  Franchot Heidelberg, M.D.

## 2011-01-24 NOTE — Assessment & Plan Note (Signed)
[facility]                         ELECTROPHYSIOLOGY OFFICE NOTE   NAME:[name], [name]                       MRN:          [id number]  DATE:08/21/2007                            DOB:          [birth year]    [name] returns today for followup.  She is a very pleasant elderly  woman with a history of a nonischemic cardiomyopathy and congestive  heart failure, who is status post ICD insertion back in September 2008.  She has done well and otherwise had no specific complaints, though she  was having problems with some heart failure symptoms back in September  2008.  The patient was seen in our device clinic back in middle  September 2008, and her R-waves and P-waves were satisfactory.  She  returns today for ICD followup.  She had no other specific complaints  today.   PHYSICAL EXAMINATION:  GENERAL:  She is a pleasant elderly woman in no  distress.  VITAL SIGNS:  The blood pressure was 150/75, the pulse was 72 and  regular, respirations were 18, the weight was 155 pounds.  NECK:  Revealed no jugular venous distention.  LUNGS:  Clear bilaterally to auscultation.  No wheezes, rales, or  rhonchi.  CARDIOVASCULAR:  Revealed a regular rate and rhythm, with normal S1 and  S2.  EXTREMITIES:  Demonstrated no edema.   Interrogation of her pacemaker demonstrates a St. Jude dual-chamber  device.  The P-waves and the pacing impedances in the atrium and the  ventricle were stable.  However, the threshold in the V was greater than  6 V at 1 msec, and the R-waves were under 4 mV.  This was a sudden  change which occurred approximately four weeks ago based on her change  in R-wave monitor.   IMPRESSION:  1. Defibrillator lead dysfunction, likely secondary to late lead      dislodgement.  2. Nonischemic cardiomyopathy.  3. Congestive heart failure.   DISCUSSION:  Overall, today we have reprogrammed [name]'s  defibrillator to minimize any unneeded  defibrillator discharges.  My  suspicion is that the patient's lead dislodged at some point  approximately three to five weeks ago.  I will plan on scheduling her  for defibrillator lead revision in the next week or so.     Doylene Canning. Ladona Ridgel, MD  Electronically Signed    GWT/MedQ  DD: 08/21/2007  DT: 08/22/2007  Job #: [id number]   cc:   Dani Gobble, MD

## 2011-01-27 NOTE — Procedures (Signed)
   NAME:  [name], [name]                          ACCOUNT NO.:  [id number]   MEDICAL RECORD NO.:  [id number]                   PATIENT TYPE:  INP   LOCATION:  A214                                 FACILITY:  [facility]   PHYSICIAN:  Vida Roller, M.D.                DATE OF BIRTH:  31-Oct-1926   DATE OF PROCEDURE:  10/15/2002  DATE OF DISCHARGE:                                  ECHOCARDIOGRAM   REFERRING PHYSICIAN:  Dirk Dress. Katrinka Blazing, M.D.   INDICATION:  This is assessment for left ventricular function in a patient  with ventricular trigeminy.   M-MODE MEASUREMENTS:  Aorta is 26 mm.   Left atrium is 45 mm which is enlarged.   Septum is 11 mm.   Posterior wall is 11 mm.   Left ventricular diastolic dimension is 53 mm.   The left ventricular systolic dimension is 40 mm which is borderline  enlarged.   2-D AND DOPPLER FINDINGS:  The left ventricle is mildly enlarged with mildly  depressed left ventricular ejection fraction at 50 to 55% which is the low  end of normal.  There is mild hypokinesis of the septum with a little bit of  thinning in the distal portions of the septum.  Diastolic function is mildly  impaired, although not adequately assessed.   Right ventricle is normal size with normal systolic function.   The right atrium is normal size.   The left atrium is mildly enlarged.  There is an intact atrial septum  ________.   The aortic valve is mildly sclerotic with trace aortic insufficiency.  No  stenosis is seen.   The mitral valve has mild mitral annular calcification with mitral  regurgitation.  No stenosis is seen.   The pulmonic valve is not well seen.   The tricuspid valve is morphologically unremarkable with mild tricuspid  regurgitation.  No stenosis is seen.   The pericardial structures appear to be slightly enlarged, consistent with a  pericardial effusion versus a pericardial fat pad.                                               Vida Roller,  M.D.    JH/MEDQ  D:  10/15/2002  T:  10/15/2002  Job:  [id number]

## 2011-01-27 NOTE — H&P (Signed)
[name], [name]                          ACCOUNT NO.:  [id number]   MEDICAL RECORD NO.:  [id number]                   PATIENT TYPE:  AMB   LOCATION:  DAY                                  FACILITY:  [facility]   PHYSICIAN:  Jerolyn Shin C. Katrinka Blazing, M.D.                DATE OF BIRTH:  03-Dec-1926   DATE OF ADMISSION:  DATE OF DISCHARGE:                                HISTORY & PHYSICAL   HISTORY OF PRESENT ILLNESS:  [age]-year-old female with history of a  giant incisional hernia on 13 March 2000.  This hernia repair was done with  replacement of the anterior abdominal wall with Marlex mesh.  The patient  did well in the postoperative period.  Over the past two weeks she has  developed a suture granuloma with a small fistulous tract.  She has been  treated with Cleocin, but she continues to have drainage and it is worse in  that the drainage extends down to the mesh.  It is felt that we need to  excise the area with removal of foreign body, which is probably a large  piece of #1 Prolene suture.  This has been explained to the patient.  She  understands that she will have to have drains placed with skin closed over  the drains.   PAST MEDICAL HISTORY:  History of intestinal obstruction dating back to  February 1990 due to diverticular disease.  She had fecal perforation and  fecal peritonitis.  She had a prolonged hospitalization and developed  multiple draining fistulas.  These fistulas have been excised.  She  developed a large hernia over her anterior abdominal wall that was repaired  in July 2001.  Amazingly she has not had any other major medial illness.  She has had no cardiac arrest or problems.  Her only other surgeries include  cholecystectomy and hysterectomy.   PRESENT MEDICATIONS:  1. Ibuprofen p.r.n.  2. Imodium 2 mg p.r.n.  3. Calcium q.d.   ALLERGIES:  CIPRO and MORPHINE.   PHYSICAL EXAMINATION:  GENERAL:  The patient is an elderly female in  __________ distress.  VITAL SIGNS:  Blood pressure 118/68, pulse 62, respirations 18 and weight  174 pounds.  HEENT:  Unremarkable.  NECK:  Supple.  No JVD or bruit.  CHEST:  Clear to auscultation.  No rales, rubs, rhonchi or wheezes.  HEART:  Regular rate and rhythm without murmur, gallop or rub.  Normal S1  and S2.  ABDOMEN:  Obese, but soft.  Normoactive bowel sounds.  There is a fistulous  tract in the midabdomen with mild erythema surrounding the tract, with a  small amount of purulence.  There is no major cellulitis.  No evidence of  recurrent hernia.  EXTREMITIES:  No cyanosis, clubbing or edema.  No major joint deformity.  NEUROLOGIC:  Exam; no motor, sensory or cerebellar deficit.  Cranial nerves  intact.  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   PLAN:  Wide excision with primary closure and drainage of the fistulous  tract.                                                    Dirk Dress. Katrinka Blazing, M.D.    LCS/MEDQ  D:  05/15/2002  T:  05/16/2002  Job:  [id number]   cc:   Dionicia Abler, M.D.

## 2011-01-27 NOTE — Discharge Summary (Signed)
   NAMEMONSERRATE, BLASCHKE                          ACCOUNT NO.:  [id number]   MEDICAL RECORD NO.:  [id number]                   PATIENT TYPE:  INP   LOCATION:  A214                                 FACILITY:  [facility]   PHYSICIAN:  Dirk Dress. Katrinka Blazing, M.D.                DATE OF BIRTH:  May 30, 1927   DATE OF ADMISSION:  10/14/2002  DATE OF DISCHARGE:  10/20/2002                                 DISCHARGE SUMMARY   DISCHARGE DIAGNOSES:  1. Recurrent fistulous tract to the anterior abdominal wall.  2. Postoperative ventricular trigeminy, treated - resolved.   SPECIAL PROCEDURE:  __________ wide excision and primary closure with  abscessed fistula to anterior abdominal wall.   DISPOSITION:  Patient was discharged home in stable and satisfactory  condition.   DISCHARGE MEDICATIONS:  1. Cleocin 300 mg q.i.d.  2. Dilaudid 25 mg q.i.d. p.r.n.  3. Imodium p.r.n.  4. Ibuprofen p.r.n.   Patient will be seen in the office two weeks post discharge.   SUMMARY:  A 77 year old female with a history of repair of a large  incisional hernia in July 2001.  The entire abdominal wall was replaced with  Marlex mesh.  She developed multiple fistulous tracts related to Prolene.  She had excision of a large fistulous tract in September 2003.  She  developed another area of tenderness in left lower abdomen, and this was  felt to be due to a fistulous tract probably extending down to the mesh.  She was admitted to have the area excised, drained and treatment with oral  antibiotics.  __________.  The patient underwent exploratory evaluation of  her anterior abdominal wall on February 3 successfully.  Skin, fat, muscle  fascia, and some of the mesh of the anterior abdominal wall was excised, and  the area was drained with a JP drain __________.   In the postoperative period she had some ventricular bigeminy.  She was seen  in consultation by Dr. Vida Roller who felt that her condition was a  benign condition  probably related to stress.  She was continued on IV  antibiotics till the time of discharge.  After cardiology had completed  their evaluation she was discharged home.  She was stable at the time of  discharge.                                                Dirk Dress. Katrinka Blazing, M.D.    LCS/MEDQ  D:  02/14/2003  T:  02/14/2003  Job:  [id number]

## 2011-01-27 NOTE — H&P (Signed)
NAMESTEVI, [name]                          ACCOUNT NO.:  [id number]   MEDICAL RECORD NO.:  [id number]                   PATIENT TYPE:  INP   LOCATION:  A209                                 FACILITY:  [facility]   PHYSICIAN:  Dirk Dress. Katrinka Blazing, M.D.                DATE OF BIRTH:  1927-08-13   DATE OF ADMISSION:  11/06/2003  DATE OF DISCHARGE:  11/09/2003                                HISTORY & PHYSICAL   DISCHARGE DIAGNOSIS:  1. Abdominal wall abscess, recurrent.   PROCEDURE:  Wide excision of abdominal wall abscess with full thickness  excision, February 25th.   DISPOSITION:  Patient is discharged home in stable and satisfactory  condition.   DISCHARGE MEDICATIONS:  1. Cleocin 300 mg q.i.d.  2. Vibramycin 100 mg b.i.d.  3. Mepergan Fortis 1-2 q.4h. p.r.n. pain.   Patient will have a home health nurse to do daily dressing changes with  flushings of saline, wet-to-dry dressings, and ABD pads.  She will be seen  in the office two weeks post discharge.   HISTORY:  A 77 year old female with a growth to her lower abdominal wall  with surrounding cellulitis three days prior to evaluation.  She has a  history of multiple abdominal wall abscesses.  It was established that she  had another recurrent abscess with a fistulous tract.  She last had a  fistulous tract of her abdominal wall excised in February, 2004.  Past  history is compatible with colon preparation with fecal peritonitis in 1990.  She had a prolonged hospital course with multiple fistulous tracts.  She had  delayed closure with mesh graft that became secondarily infected.  The mesh  graft was subsequently removed and replaced.  She has no other problems.  Other surgeries include cholecystectomy, hysterectomy, incisional hernia  repair, left colectomy, and multiple stomal closures.   Exam was unremarkable.   PHYSICAL EXAMINATION:  VITAL SIGNS:  Stable.  ABDOMEN:  A large, inflamed indurated area and a lower midline incision  with  a bulge, compatible with abscess formation.   Patient was taken to the operating room on February 25th, and the wound was  widely excised down to the mesh.  There was no suture in the area, and only  a small amount of mesh was exposed.  This was enough to expose, and the  wound was packed open with plans for delay of closure or closure by  secondary intention.  Patient did very well during the two days that she was  hospitalized.  She had no significant drainage.  The wound appeared to be  healing uneventfully.   White count was 7000.  She was afebrile.   On the 28th, she was stable.  The wound was clean.  There was no purulence  at the base.  She was discharged home with plans for home health nursing  care.     ___________________________________________  Dirk Dress. Katrinka Blazing, M.D.   LCS/MEDQ  D:  12/15/2003  T:  12/15/2003  Job:  [id number]

## 2011-01-27 NOTE — Op Note (Signed)
   NAMEKAMARRI, FISCHETTI                          ACCOUNT NO.:  [id number]   MEDICAL RECORD NO.:  [id number]                   PATIENT TYPE:  AMB   LOCATION:  DAY                                  FACILITY:  [facility]   PHYSICIAN:  Jerolyn Shin C. Katrinka Blazing, M.D.                DATE OF BIRTH:  03-Feb-1927   DATE OF PROCEDURE:  DATE OF DISCHARGE:                                 OPERATIVE REPORT   PREOPERATIVE DIAGNOSIS:  Chronic fistulous tract anterior abdominal wall  with secondary cellulitis.   POSTOPERATIVE DIAGNOSIS:  Chronic fistulous tract anterior abdominal wall  with secondary cellulitis.   PROCEDURE:  Wide excision of fistulous tract anterior abdominal wall.   SURGEON:  Dirk Dress. Katrinka Blazing, M.D.   DESCRIPTION OF PROCEDURE:  Under general LMA anesthesia the anterior  abdominal wall was prepped and draped as a sterile field.  A wide excision  was carried out along the fistulous tract.  It was extended through the  subcutaneous tissue down to the mesh graft.  There was some chronic  granulation in the tract, but no frank purulence was noted.  The tract ended  around a piece of Prolene suture.  This Prolene suture was removed.  The  granulations were scraped and the anterior portion of the mesh was left  intact.  Most of the mesh was densely adherent.   There was another suture along the right lateral aspect of the incision.  This was also removed although it did not appear to be involved with  infection at this time. It was elected to leave the mesh back in place.  The  area was flushed with Betadine.  The JP drain was placed and the drain was  secured with 3-0 Nylon.  The subcutaneous tissue was closed over the drain  using 2-0 and 3-0 Biosyn and staples.  The dressing was placed.  The patient  was awakened, transferred to a bed and taken to the postanesthetic care  unit.                                                Dirk Dress. Katrinka Blazing, M.D.    LCS/MEDQ  D:  05/16/2002  T:  05/16/2002  Job:   [id number]   cc:   Lionel December, M.D.

## 2011-01-27 NOTE — Op Note (Signed)
[name], [name]                          ACCOUNT NO.:  [id number]   MEDICAL RECORD NO.:  [id number]                   PATIENT TYPE:  AMB   LOCATION:  DAY                                  FACILITY:  [facility]   PHYSICIAN:  Jerolyn Shin C. Katrinka Blazing, M.D.                DATE OF BIRTH:  Mar 12, 1927   DATE OF PROCEDURE:  DATE OF DISCHARGE:                                 OPERATIVE REPORT   INDICATIONS FOR PROCEDURE:  Recurrent large abdominal wall abscess.   PREOPERATIVE DIAGNOSES:  Abdominal wall abscess.   POSTOPERATIVE DIAGNOSES:  Abdominal wall abscess.   PROCEDURE:  Wide excision of abdominal wall abscess full thickness.   SURGEON:  Dirk Dress. Katrinka Blazing, M.D.   DESCRIPTION OF PROCEDURE:  Under general anesthesia the patient's abdomen  was prepped and draped in a sterile field.  There was a large abscess in the  infraumbilical midline.  An elliptical incision was made around the abscess  and the inflamed tissue extending about 4 cm on each side of the abscess.  The incision was extended down to the fascia of the abdominal wall. At the  base of the incision, I encountered a fistulous tract that extended to a  small pocket of purulence that was overlying a small piece of mesh graft.  This was a portion of the mesh that was previously placed at the last  procedure.  No sutures could be identified.  The full thickness of the  abdominal wall down to the mesh was excised.  The wound was irrigated and  flushed.  Cultures were taken.  Hemostasis was achieved. The wound was then  left open.  It was packed with Betadine soaked packing.  It was covered with  4 x 4's and tape. The patient tolerated the procedure well. She was then  awakened from anesthesia uneventfully, transferred to a bed and taken to the  post anesthesia care unit for monitoring.      ___________________________________________                                            Dirk Dress. Katrinka Blazing, M.D.   LCS/MEDQ  D:  11/06/2003  T:  11/06/2003   Job:  [id number]

## 2011-01-27 NOTE — H&P (Signed)
[name], [name]                          ACCOUNT NO.:  [id number]   MEDICAL RECORD NO.:  [id number]                   PATIENT TYPE:  AMB   LOCATION:  DAY                                  FACILITY:  [facility]   PHYSICIAN:  Jerolyn Shin C. Katrinka Blazing, M.D.                DATE OF BIRTH:  [birth year]   DATE OF ADMISSION:  DATE OF DISCHARGE:                                HISTORY & PHYSICAL   HISTORY OF PRESENT ILLNESS:  [age]-year-old female with history of  growth of her lower abdominal wall with surrounding cellulitis over the past  3 days.  She has a history of multiple abdominal wall abscesses.  She has a  recurrent abscess and is scheduled to have this excised.  She last had a  fistulous tract of her abdominal wall excised in February 2004.   PAST HISTORY:  She has a history ZD:GLOVF perforation in 1990 with fecal  peritonitis.  She had a prolonged hospital course and had multiple fistulous  tracts.  She had delayed closure with the mesh graft, which became  secondarily infected.  She subsequently had multiple fistulas excised.  She  has no other major medical problem.   PAST SURGICAL HISTORY:  Other surgeries includes:  1. Cholecystectomy.  2. Hysterectomy.  3. Incisional hernia repair.  4. Left colectomy and multiple stomal closures.   ALLERGIES:  CIPRO and MORPHINE.   CHRONIC MEDICATIONS:  None.   PHYSICAL EXAMINATION:  VITAL SIGNS:  On examination, blood pressure 150/90,  pulse 64, respirations 18, weight 168 pounds.  HEENT:  Unremarkable.  NECK:  Neck is supple without JVD or bruit.  CHEST:  Chest clear to auscultation.  HEART:  Regular rate and rhythm without murmur, gallop or rub.  ABDOMEN:  Abdomen is soft, nontender.  No masses.  There is a large  inflamed, indurated area of the lower midline incision with a bulge  compatible with abscess formation.  EXTREMITIES:  No cyanosis, clubbing or edema.  NEUROLOGIC:  No focal motor, sensory or cerebellar deficit.   IMPRESSION:  Recurrent abscess and fistulas, anterior abdominal wall.   PLAN:  Wide excision with postoperative antibiotics.    ___________________________________________                                         Dirk Dress Katrinka Blazing, M.D.   LCS/MEDQ  D:  11/05/2003  T:  11/06/2003  Job:  [id number]

## 2011-01-27 NOTE — Op Note (Signed)
NAMEBRIANNIA, LABA                ACCOUNT NO.:  [id number]   MEDICAL RECORD NO.:  [id number]          PATIENT TYPE:  AMB   LOCATION:  DAY                           FACILITY:  [facility]   PHYSICIAN:  Jerolyn Shin C. Katrinka Blazing, M.D.   DATE OF BIRTH:  1926-11-08   DATE OF PROCEDURE:  DATE OF DISCHARGE:                                 OPERATIVE REPORT   PREOPERATIVE DIAGNOSIS:  Recurrent abscess and chronic fistula of anterior  abdominal wall.   POSTOPERATIVE DIAGNOSIS:  Recurrent abscess and chronic fistula of anterior  abdominal wall.   PROCEDURE:  Wide excision of abscess, fistulous tract, and mesh graft of  anterior abdominal wall.   SURGEON:  Dirk Dress. Katrinka Blazing, M.D.   DESCRIPTION:  Under general anesthesia, the patient's abdomen was prepped  and draped in a sterile field.  The fistulous tract that was in the  infraumbilical midline was probed and probing down to what appeared to be  mesh.  Wide excision encompassing the fistulous tract was carried out in an  elliptical pattern down to the abdominal wall, which was all mesh.  The  superior most aspect of the mesh was fully healed and scarred in without any  evidence of infection.  There was an area of mesh that was about 6 cm long  and about 4 cm wide that was involved in the infectious process.  There were  five Prolene sutures in the base of this.  The excess mesh was excised and  the sutures were removed.  Copious irrigation was carried out.  All  fibrinous exudate and debris was removed.  Another liter of fluid was used  to irrigate the mesh.  Being satisfied that there was no other nidus of  infection except for the mesh graft, a JP drain was placed over the mesh and  brought out through a separate stab incision.  The subcutaneous tissue was  then closed in layers over the drain with three layers of closure.  The skin  was closed with staples.  The drain was secured with 3-0 nylon.  The patient  tolerated the procedure well.  A sterile  dressing was placed.  She was  awakened from anesthesia uneventfully, transferred to a bed, and taken to  the postanesthetic care unit for further monitoring.      LCS/MEDQ  D:  06/21/2004  T:  06/21/2004  Job:  [id number]

## 2011-01-27 NOTE — H&P (Signed)
[name], [name]                          ACCOUNT NO.:  [id number]   MEDICAL RECORD NO.:  [id number]                   PATIENT TYPE:  AMB   LOCATION:  DAY                                  FACILITY:  [facility]   PHYSICIAN:  Jerolyn Shin C. Katrinka Blazing, M.D.                DATE OF BIRTH:  1926-12-20   DATE OF ADMISSION:  DATE OF DISCHARGE:                                HISTORY & PHYSICAL   HISTORY OF PRESENT ILLNESS:  [age]-year-old female with a history of  repair of a large incisional hernia in July of 2001.  The entire anterior  abdominal wall was replaced with Marlex mesh.  The patient has developed  multiple fistulous tracts related to the Prolene.  The patient had excision  of a large fistulous tract in September of 2003.  She has another area of  tenderness in the lower abdomen; this is felt to represent another fistulous  tract, probably, extending down to the mesh.  Plan is to widely excise the  area, place a drain and treat her with oral antibiotics for about three  weeks.   PAST HISTORY:  The patient had an intestinal obstruction in 1990 due to  diverticular disease.  Intraoperatively, she had perforation and developed  fecal peritonitis.  She had a prolonged hospitalization and developed  multiple draining fistulae.  She had excision of all of these fistulae over  about a 10-year period.  She developed a large hernia that was repaired  primarily in July of 2001.  She has no other major medical problems.   PAST SURGICAL HISTORY:  Cholecystectomy, hysterectomy and history as noted  above.   MEDICATIONS:  1. Ibuprofen p.r.n.  2. Imodium p.r.n.  3. Calcium daily.   ALLERGIES:  MORPHINE and CIPRO.   CLINICAL DATA:  The patient had an echocardiogram of her heart recently and  this was normal.  She also had carotid Dopplers and this was normal.   PHYSICAL EXAMINATION:  VITAL SIGNS:  On examination, blood pressure 130/80,  pulse 60, respirations 18.  Weight 166 pounds.  HEENT:  Unremarkable.  NECK:  Neck supple without JVD or bruit.  CHEST:  Chest clear to auscultation.  HEART:  Regular rate and rhythm without murmur, gallop or rub.  ABDOMEN:  Abdomen obese, soft with her inflamed, thickened area of the right  lateral abdominal wall with erythema and induration but without drainage.  Normoactive bowel sounds.  EXTREMITIES:  No cyanosis, clubbing or edema.  NEUROLOGIC:  Exam nonfocal.   IMPRESSION:  Recurrent fistulous tract of anterior abdominal wall.   PLAN:  Wide excision with followup antibiotics, which will be based on  cultures, but initially she will be treated with Cleocin and Levaquin.  Dirk Dress. Katrinka Blazing, M.D.    LCS/MEDQ  D:  10/13/2002  T:  10/14/2002  Job:  [id number]

## 2011-01-27 NOTE — Op Note (Signed)
[name], [name]                          ACCOUNT NO.:  [id number]   MEDICAL RECORD NO.:  [id number]                   PATIENT TYPE:  INP   LOCATION:  A214                                 FACILITY:  [facility]   PHYSICIAN:  Dirk Dress. Katrinka Blazing, M.D.                DATE OF BIRTH:  [birth year]   DATE OF PROCEDURE:  10/14/2002  DATE OF DISCHARGE:                                 OPERATIVE REPORT   PREOPERATIVE DIAGNOSIS:  Chronic fistulous tract of the abdominal wall.   POSTOPERATIVE DIAGNOSIS:  Chronic fistulous tract of the abdominal wall with  abscess.   OPERATION PERFORMED:  Wide excision of abscess and fistula of anterior  abdominal wall containing full thickness of skin, fat, muscle, fascia and  mesh graft.   SURGEON:  Dirk Dress. Katrinka Blazing, M.D.   ANESTHESIA:  General.   INDICATIONS FOR PROCEDURE:  The patient is a 77 year old female with history  of colon perforation in 1990 with fecal peritonitis.  She had resection and  had a prolonged hospital course for many months.  Has had multiple fistulous  tracts.  She had delayed closure which became secondarily infected.  She  subsequently had multiple fistulas which were excised.  After about 10 years  she had mesh graft placed which has had four fistulas.  The patient has an  inflamed area of the right lower quadrant over a previous incision.  She is  to have this area excised down to the fascia and probably mesh.   DESCRIPTION OF PROCEDURE:  Under general anesthesia, the patient's abdomen  was prepped and draped as a sterile field.  An elliptical incision was made  encompassing the inflammatory lesion in the right lower quadrant.  This was  extended down through the subcutaneous fat layer down to the fascia and mesh  graft.  This tissue was then removed from the fascia.  In the midportion of  the area of excision, there was a fistulous tract that extended below the  muscle and fascia.  This was opened and in the base of the wound was a  piece  of mesh graft with at least five sutures.  These sutures were removed and  the graft was debrided back to an area where it was confluent with soft  tissue with no evidence of granulation tissue and no evidence of tissue  necrosis.  The base of this deep cavity was irrigated and then further  trimming of the mesh with graft was carried out until there were no free  ends of mesh graft.  At least five sutures were removed.  These were the  sutures that were holding the mesh graft in place.  This was the anterior  surface of the mesh.  Since she had a double layer of mesh, it was felt that  there was enough mesh to hold her together without the potential for  repeated infections if we could  get this to close.  Pulses were taken.  There was a fistulous tract that extended beneath the muscle and around to  the right flank.  This was followed and this tract was opened.  A Al Pimple drain was placed in the tract after all granulation tissue was  removed.  The drain was brought out through a separate incision inferiorly.  The peritoneal cavity was not violated.  The subcutaneous tissue was closed  in three layers over the drain.  The skin was closed with staples.  A  sterile dressing  was placed.  The patient tolerated the procedure well.  The drain was  sutured with 3-0 nylon.  The patient tolerated the procedure well. She was  awakened from anesthesia uneventfully, transferred to a pad and taken to the  post anesthesia care unit.                                                Dirk Dress. Katrinka Blazing, M.D.    LCS/MEDQ  D:  10/14/2002  T:  10/14/2002  Job:  [id number]

## 2011-01-27 NOTE — Consult Note (Signed)
[name], [name]                          ACCOUNT NO.:  [id number]   MEDICAL RECORD NO.:  [id number]                   PATIENT TYPE:  INP   LOCATION:  A214                                 FACILITY:  [facility]   PHYSICIAN:  Vida Roller, M.D.                DATE OF BIRTH:  [birth year]   DATE OF CONSULTATION:  10/14/2002  DATE OF DISCHARGE:                                   CONSULTATION   CARDIOLOGY CONSULTATION:   REFERRING PHYSICIAN:  Dirk Dress. Katrinka Blazing, M.D.   PRIMARY CARE PHYSICIAN:  Lionel December, M.D.   HISTORY OF PRESENT ILLNESS:  This patient is a 77 year old white female with  no known cardiac disease. 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  Postoperatively she was  found to have frequent extra beats with evidence of ventricular trigeminy  and we were consulted for the assessment of the etiology of this.   The patient describes excellent exercise tolerance prior to her surgery.  She is a very healthy woman who continues to be active. She describes no  decrease in her exercise tolerance and generally feels very well.  She  denies any chest discomfort or shortness of breath.  She has had a recent  evaluation of her left ventricular function in an echocardiogram in November  2003 where she had normal left ventricular systolic function and carotid  Dopplers which were nonobstructive after an episode of change in her visual  acuity, suspected to be a TIA.  The patient's perioperative course was  unremarkable.  She did very well under general endotracheal anesthetic and  denies any symptoms at present; including, no palpitations or shortness of  breath. She is able to ambulate to the bathroom without difficulty.   PAST MEDICAL HISTORY:  Her past medical history is significant for  significant abdominal surgeries, intestinal obstructions secondary to  diverticular disease with several operative surgeries including  perforation  and fecal peritonitis.  She is status post inguinal hernia repair,  cholecystectomy and a hysterectomy.  She also has a history of suspected TIA  as described previously.   ALLERGIES:  She is allergic to Cipro and morphine both of which give her  rash.   CURRENT MEDICATIONS:  Her medications on admission were calcium 500 mg a  day, ibuprofen as needed and Imodium for her diarrhea.  In the hospital she  has been treated with Phenergan for her nausea and Ancef 1 gm q.8h. and  Cleocin 400 mg IV q.6h., Imodium, Restoril and Tylenol as needed.   SOCIAL HISTORY:  She lives in [place] with her daughter and her grandson.  She is widowed. She has 6 children all of whom are healthy.  She has a 35-  pack-year smoking history and quit over 20 years ago.  She is very active  and does not use any drugs or alcohol.  Her mother died at 58 of diabetes  with heart problems. Her father died at 86 of a melanoma.  She has 1 sibling  with lung cancer who is alive.  Her review of systems is noncontributory.   PHYSICAL EXAMINATION:  GENERAL:  On physical exam she is a well-developed,  elderly, white female in no apparent distress, who is alert and oriented x4  and an excellent historian.  VITAL SIGNS: Temperature is 97.6;  blood pressure was 102/53; heart rate of  80 with ventricular bigeminy, respiratory rate of 20.  HEAD, EYES, EARS, NOSE, AND THROAT:  Unremarkable.  She is normocephalic and  atraumatic with pupils that are equal and reactive to light.  Extraocular  movements are intact.  NECK:  Her neck is supple with no jugular venous distention or carotid  bruits.  No lymphadenopathy is seen.  CHEST:  Her chest is clear to auscultation.  CARDIAC:  Reveals nondisplaced point of maximal impulse with no lifts or  thrills.  First and second heart sounds are normal.  There were no third or  fourth heart sounds and no murmurs were noted.  ABDOMEN:  Her abdomen is soft. There is a dressing in  place and JP drain in  place but no evidence of tenderness other than the appropriate postoperative  tenderness.  GENITOURINARY AND RECTAL:  Exams are deferred.  EXTREMITIES:  Lower extremities revealed no clubbing, cyanosis, or edema.  Distal pulses are intact with no bruits.  NEUROLOGIC:  Her neurologic exam is nonfocal.   X-RAY AND LABORATORY DATA:  Her electrocardiogram reveals a sinus rhythm at  a rate of 83 with ventricular trigeminy and a monofocal PVC pattern.  The  PVC's themselves are in a right bundle branch block configuration.  There  are no other ST-T waves changes, and all the intervals are normal with no  left ventricular hypertrophy.   LABORATORY DATA:  Her white count is 6.5 with an H&H of 14 and 42, 228,000  platelets.  Sodium 139, potassium 3.8, chloride 106, CO2 of 30, BUN of 7,  creatinine of 0.8, and her blood sugar is 135, nonfasting.  Liver function  studies are normal including a total protein and albumin.  Calcium was 9.0.   ASSESSMENT:  This is a woman with ventricular trigeminy which is  asymptomatic after a significant postoperative period.  This is likely a  benign condition.  However, we will reassess her left ventricular systolic  function to make sure that we are not dealing with a patient with depressed  left ventricular function with premature ventricular contractions which  would be evidence of ischemia.  We will also assess her for coronary artery  disease noninvasively with a perfusion imaging scan, Cardiolite with  adenosine.  Her antibiotics will continue and thus the evaluation can be  done during the period in which she is receiving her IV antibiotics.                                               Vida Roller, M.D.    JH/MEDQ  D:  10/14/2002  T:  10/14/2002  Job:  [id number]

## 2011-03-21 ENCOUNTER — Encounter: Payer: Self-pay | Admitting: Internal Medicine

## 2011-06-13 LAB — CBC
HCT: 37.8 % (ref 36.0–46.0)
HCT: 39.5 % (ref 36.0–46.0)
Hemoglobin: 12.5 g/dL (ref 12.0–15.0)
MCHC: 33 g/dL (ref 30.0–36.0)
MCV: 86.9 fL (ref 78.0–100.0)
MCV: 87.6 fL (ref 78.0–100.0)
Platelets: 233 10*3/uL (ref 150–400)
RBC: 4.31 MIL/uL (ref 3.87–5.11)
RBC: 4.54 MIL/uL (ref 3.87–5.11)
WBC: 6.1 10*3/uL (ref 4.0–10.5)
WBC: 6.4 10*3/uL (ref 4.0–10.5)

## 2011-06-13 LAB — DIFFERENTIAL
Basophils Relative: 1 % (ref 0–1)
Eosinophils Absolute: 0 10*3/uL (ref 0.0–0.7)
Eosinophils Absolute: 0.1 10*3/uL (ref 0.0–0.7)
Lymphocytes Relative: 21 % (ref 12–46)
Lymphs Abs: 1.3 10*3/uL (ref 0.7–4.0)
Lymphs Abs: 1.6 10*3/uL (ref 0.7–4.0)
Monocytes Absolute: 0.4 10*3/uL (ref 0.1–1.0)
Monocytes Relative: 5 % (ref 3–12)
Monocytes Relative: 7 % (ref 3–12)
Neutrophils Relative %: 67 % (ref 43–77)
Neutrophils Relative %: 73 % (ref 43–77)

## 2011-06-13 LAB — POCT CARDIAC MARKERS
Myoglobin, poc: 69.8 ng/mL (ref 12–200)
Myoglobin, poc: 70 ng/mL (ref 12–200)
Troponin i, poc: <0.05 ng/mL (ref 0.00–0.09)

## 2011-06-13 LAB — BASIC METABOLIC PANEL
BUN: 6 mg/dL (ref 6–23)
BUN: 6 mg/dL (ref 6–23)
BUN: 7 mg/dL (ref 6–23)
CO2: 28 mEq/L (ref 19–32)
CO2: 28 mEq/L (ref 19–32)
Calcium: 8.7 mg/dL (ref 8.4–10.5)
Chloride: 107 mEq/L (ref 96–112)
Chloride: 109 mEq/L (ref 96–112)
Chloride: 109 mEq/L (ref 96–112)
Creatinine, Ser: 0.91 mg/dL (ref 0.4–1.2)
Creatinine, Ser: 1.15 mg/dL (ref 0.4–1.2)
GFR calc Af Amer: 55 mL/min — ABNORMAL LOW (ref >60)
GFR calc Af Amer: 60 mL/min — ABNORMAL LOW (ref >60)
GFR calc Af Amer: >60 mL/min (ref >60)
GFR calc non Af Amer: 46 mL/min — ABNORMAL LOW (ref >60)
Glucose, Bld: 111 mg/dL — ABNORMAL HIGH (ref 70–99)
Potassium: 3.1 mEq/L — ABNORMAL LOW (ref 3.5–5.1)
Potassium: 3.9 mEq/L (ref 3.5–5.1)

## 2011-06-13 LAB — APTT: aPTT: 34 seconds (ref 24–37)

## 2011-06-13 LAB — B-NATRIURETIC PEPTIDE (CONVERTED LAB): Pro B Natriuretic peptide (BNP): 86.4 pg/mL (ref 0.0–100.0)

## 2011-06-13 LAB — TSH: TSH: 3.803 u[IU]/mL (ref 0.350–4.500)

## 2011-06-13 LAB — LIPID PANEL
LDL Cholesterol: 79 mg/dL (ref 0–99)
VLDL: 21 mg/dL (ref 0–40)

## 2011-06-13 LAB — PHOSPHORUS: Phosphorus: 2.9 mg/dL (ref 2.3–4.6)

## 2011-06-15 LAB — CARDIAC PANEL(CRET KIN+CKTOT+MB+TROPI)
CK, MB: 1.6 ng/mL (ref 0.3–4.0)
CK, MB: 1.9 ng/mL (ref 0.3–4.0)
Relative Index: INVALID (ref 0.0–2.5)
Relative Index: INVALID (ref 0.0–2.5)
Total CK: 49 U/L (ref 7–177)
Total CK: 62 U/L (ref 7–177)
Troponin I: 0.02 ng/mL (ref 0.00–0.06)
Troponin I: 0.02 ng/mL (ref 0.00–0.06)

## 2011-06-15 LAB — COMPREHENSIVE METABOLIC PANEL
ALT: 13 U/L (ref 0–35)
AST: 26 U/L (ref 0–37)
Albumin: 3.1 g/dL — ABNORMAL LOW (ref 3.5–5.2)
Alkaline Phosphatase: 73 U/L (ref 39–117)
BUN: 7 mg/dL (ref 6–23)
CO2: 28 mEq/L (ref 19–32)
Calcium: 9 mg/dL (ref 8.4–10.5)
Chloride: 110 mEq/L (ref 96–112)
Creatinine, Ser: 0.97 mg/dL (ref 0.4–1.2)
GFR calc Af Amer: >60 mL/min (ref >60)
GFR calc non Af Amer: 55 mL/min — ABNORMAL LOW (ref >60)
Glucose, Bld: 80 mg/dL (ref 70–99)
Potassium: 3.8 mEq/L (ref 3.5–5.1)
Sodium: 143 mEq/L (ref 135–145)
Total Bilirubin: 0.3 mg/dL (ref 0.3–1.2)
Total Protein: 5.8 g/dL — ABNORMAL LOW (ref 6.0–8.3)

## 2011-06-15 LAB — BASIC METABOLIC PANEL
CO2: 23 mEq/L (ref 19–32)
Chloride: 104 mEq/L (ref 96–112)
GFR calc Af Amer: >60 mL/min (ref >60)
Glucose, Bld: 108 mg/dL — ABNORMAL HIGH (ref 70–99)
Sodium: 138 mEq/L (ref 135–145)

## 2011-06-15 LAB — DIFFERENTIAL
Basophils Absolute: 0.1 10*3/uL (ref 0.0–0.1)
Basophils Relative: 1 % (ref 0–1)
Eosinophils Absolute: 0.1 10*3/uL (ref 0.0–0.7)
Eosinophils Relative: 1 % (ref 0–5)
Lymphocytes Relative: 29 % (ref 12–46)
Lymphs Abs: 2 10*3/uL (ref 0.7–4.0)
Monocytes Absolute: 0.4 10*3/uL (ref 0.1–1.0)
Monocytes Relative: 6 % (ref 3–12)
Neutro Abs: 4.3 10*3/uL (ref 1.7–7.7)
Neutrophils Relative %: 62 % (ref 43–77)

## 2011-06-15 LAB — TSH: TSH: 3.467 u[IU]/mL (ref 0.350–4.500)

## 2011-06-15 LAB — CBC
Hemoglobin: 13.5 g/dL (ref 12.0–15.0)
MCHC: 32.9 g/dL (ref 30.0–36.0)
MCV: 87.6 fL (ref 78.0–100.0)
Platelets: 250 10*3/uL (ref 150–400)
RBC: 4.52 MIL/uL (ref 3.87–5.11)
RBC: 4.69 MIL/uL (ref 3.87–5.11)
WBC: 6.9 10*3/uL (ref 4.0–10.5)

## 2011-06-15 LAB — B-NATRIURETIC PEPTIDE (CONVERTED LAB): Pro B Natriuretic peptide (BNP): 304 pg/mL — ABNORMAL HIGH (ref 0.0–100.0)

## 2011-06-16 LAB — CARDIAC PANEL(CRET KIN+CKTOT+MB+TROPI)
CK, MB: 1.3
CK, MB: 1.3
Relative Index: INVALID
Relative Index: INVALID
Total CK: 37
Total CK: 40
Troponin I: 0.01
Troponin I: 0.02

## 2011-06-16 LAB — CBC
HCT: 34.3 — ABNORMAL LOW
Hemoglobin: 11.5 — ABNORMAL LOW
MCHC: 33.5
MCV: 85.1
Platelets: 223
RBC: 4.03
RDW: 16 — ABNORMAL HIGH
WBC: 5.6

## 2011-06-16 LAB — BASIC METABOLIC PANEL
BUN: 5 — ABNORMAL LOW
Calcium: 8.3 — ABNORMAL LOW
GFR calc non Af Amer: >60
Glucose, Bld: 83
Sodium: 142

## 2011-06-19 LAB — COMPREHENSIVE METABOLIC PANEL
Albumin: 3 — ABNORMAL LOW
BUN: 7
CO2: 29
Calcium: 8.3 — ABNORMAL LOW
Chloride: 106
Creatinine, Ser: 0.94
GFR calc Af Amer: >60
Potassium: 4
Sodium: 140
Total Protein: 5.7 — ABNORMAL LOW

## 2011-06-19 LAB — POCT CARDIAC MARKERS
CKMB, poc: <1 — ABNORMAL LOW
Myoglobin, poc: 62.3
Myoglobin, poc: 64.9

## 2011-06-19 LAB — DIFFERENTIAL
Basophils Relative: 1
Basophils Relative: 1
Eosinophils Absolute: 0.1 — ABNORMAL LOW
Lymphocytes Relative: 25
Lymphs Abs: 1.6
Monocytes Absolute: 0.3
Monocytes Relative: 5
Monocytes Relative: 6
Neutro Abs: 4.2
Neutrophils Relative %: 68

## 2011-06-19 LAB — CBC
HCT: 36.2
Hemoglobin: 12
MCHC: 33.2
MCV: 85.9
MCV: 86
RBC: 4.07
RBC: 4.21
WBC: 6.2

## 2011-06-19 LAB — CARDIAC PANEL(CRET KIN+CKTOT+MB+TROPI)
CK, MB: 1.8
Relative Index: INVALID
Total CK: 50

## 2011-06-19 LAB — I-STAT 8, (EC8 V) (CONVERTED LAB)
BUN: 8
Chloride: 107
Hemoglobin: 12.6
Operator id: 285491
pCO2, Ven: 50.8 — ABNORMAL HIGH

## 2011-06-22 LAB — BLOOD GAS, ARTERIAL
Bicarbonate: 23.3
FIO2: 0.21
Patient temperature: 97.4
TCO2: 24.5
pCO2 arterial: 39
pH, Arterial: 7.39

## 2011-06-22 LAB — CBC
HCT: 35.5 — ABNORMAL LOW
Hemoglobin: 10.2 — ABNORMAL LOW
Hemoglobin: 11.8 — ABNORMAL LOW
MCHC: 33.2
MCV: 85
RBC: 3.63 — ABNORMAL LOW
RBC: 4.18
WBC: 8.3

## 2011-06-22 LAB — BASIC METABOLIC PANEL
Calcium: 8.7
GFR calc Af Amer: >60
GFR calc non Af Amer: >60
Potassium: 3.9
Sodium: 135

## 2011-06-22 LAB — DIFFERENTIAL
Basophils Relative: 0
Eosinophils Relative: 1
Monocytes Absolute: 0.8 — ABNORMAL HIGH
Monocytes Relative: 10
Neutro Abs: 5.7

## 2011-06-22 LAB — POCT I-STAT CREATININE: Operator id: 282201

## 2011-06-22 LAB — I-STAT 8, (EC8 V) (CONVERTED LAB)
Acid-base deficit: 3 — ABNORMAL HIGH
Chloride: 105
HCT: 39
Operator id: 282201
TCO2: 21
pCO2, Ven: 27.3 — ABNORMAL LOW
pH, Ven: 7.467 — ABNORMAL HIGH

## 2011-06-22 LAB — POCT CARDIAC MARKERS
CKMB, poc: 1.4
Myoglobin, poc: 74.1
Troponin i, poc: <0.05

## 2011-06-22 LAB — MAGNESIUM: Magnesium: 1.8

## 2011-06-22 LAB — D-DIMER, QUANTITATIVE: D-Dimer, Quant: 2.6 — ABNORMAL HIGH

## 2011-06-22 LAB — CK TOTAL AND CKMB (NOT AT ARMC)
CK, MB: 1.1
Relative Index: INVALID

## 2011-06-23 LAB — URINALYSIS, ROUTINE W REFLEX MICROSCOPIC
Hgb urine dipstick: NEGATIVE
Nitrite: NEGATIVE
Protein, ur: NEGATIVE
Urobilinogen, UA: 1

## 2011-06-23 LAB — MAGNESIUM: Magnesium: 1.9

## 2011-06-23 LAB — DIFFERENTIAL
Basophils Absolute: 0
Basophils Absolute: 0
Basophils Absolute: 0
Basophils Relative: 0
Basophils Relative: 0
Basophils Relative: 0
Basophils Relative: 1
Eosinophils Absolute: 0
Eosinophils Absolute: 0.1
Eosinophils Relative: 2
Lymphs Abs: 1.8
Monocytes Absolute: 0.3
Monocytes Absolute: 0.4
Monocytes Relative: 11
Monocytes Relative: 14 — ABNORMAL HIGH
Monocytes Relative: 7
Neutro Abs: 3.4
Neutro Abs: 4.7
Neutrophils Relative %: 62
Neutrophils Relative %: 74
Neutrophils Relative %: 81 — ABNORMAL HIGH

## 2011-06-23 LAB — I-STAT 8, (EC8 V) (CONVERTED LAB)
Acid-Base Excess: 3 — ABNORMAL HIGH
Acid-Base Excess: 3 — ABNORMAL HIGH
BUN: 9
Bicarbonate: 24.7 — ABNORMAL HIGH
Chloride: 105
Glucose, Bld: 105 — ABNORMAL HIGH
Glucose, Bld: 121 — ABNORMAL HIGH
Glucose, Bld: 139 — ABNORMAL HIGH
Hemoglobin: 11.9 — ABNORMAL LOW
Hemoglobin: 13.3
Hemoglobin: 13.6
Potassium: 3.1 — ABNORMAL LOW
Potassium: 3.5
Sodium: 135
Sodium: 141
TCO2: 27
TCO2: 29
pCO2, Ven: 44.3 — ABNORMAL LOW
pH, Ven: 7.424 — ABNORMAL HIGH

## 2011-06-23 LAB — CBC
HCT: 34.8 — ABNORMAL LOW
HCT: 36.2
HCT: 36.4
Hemoglobin: 11.8 — ABNORMAL LOW
Hemoglobin: 12.1
Hemoglobin: 12.2
MCHC: 33.3
MCHC: 33.7
MCHC: 33.7
MCHC: 34
MCHC: 34
MCV: 83.6
MCV: 84
MCV: 84.5
MCV: 84.7
Platelets: 144 — ABNORMAL LOW
Platelets: 278
RBC: 4.1
RBC: 4.13
RBC: 4.14
RBC: 4.34
RDW: 17.9 — ABNORMAL HIGH
RDW: 18.2 — ABNORMAL HIGH
RDW: 18.6 — ABNORMAL HIGH
WBC: 6.3
WBC: 6.4
WBC: 6.7

## 2011-06-23 LAB — POCT I-STAT CREATININE
Creatinine, Ser: 0.7
Creatinine, Ser: 0.8
Creatinine, Ser: 1.2
Operator id: 270651
Operator id: 279831

## 2011-06-23 LAB — POCT CARDIAC MARKERS
Myoglobin, poc: 293
Myoglobin, poc: 53
Operator id: 288331

## 2011-06-23 LAB — BASIC METABOLIC PANEL
BUN: 7
CO2: 28
CO2: 29
CO2: 29
CO2: 30
Calcium: 8.3 — ABNORMAL LOW
Calcium: 9
Chloride: 104
Chloride: 106
Chloride: 109
Chloride: 110
Creatinine, Ser: 0.69
Creatinine, Ser: 0.71
Creatinine, Ser: 1.25 — ABNORMAL HIGH
GFR calc Af Amer: 50 — ABNORMAL LOW
GFR calc Af Amer: >60
GFR calc Af Amer: >60
GFR calc Af Amer: >60
Glucose, Bld: 84
Glucose, Bld: 91
Potassium: 3 — ABNORMAL LOW
Potassium: 3.7
Potassium: 4.4
Sodium: 142
Sodium: 143

## 2011-06-23 LAB — COMPREHENSIVE METABOLIC PANEL
Alkaline Phosphatase: 64
Alkaline Phosphatase: 72
BUN: 10
BUN: 7
CO2: 29
Chloride: 101
Chloride: 106
Creatinine, Ser: 0.68
GFR calc non Af Amer: >60
Glucose, Bld: 114 — ABNORMAL HIGH
Glucose, Bld: 92
Potassium: 3.2 — ABNORMAL LOW
Potassium: 3.5
Total Bilirubin: 0.7
Total Bilirubin: 0.9

## 2011-06-23 LAB — PROTIME-INR
INR: 1
INR: 1.1
Prothrombin Time: 14.6

## 2011-06-23 LAB — CARDIAC PANEL(CRET KIN+CKTOT+MB+TROPI)
CK, MB: 2.8
Relative Index: INVALID
Total CK: 71
Troponin I: 0.03

## 2011-06-23 LAB — COMPREHENSIVE METABOLIC PANEL WITH GFR
ALT: 21
AST: 49 — ABNORMAL HIGH
Albumin: 2.6 — ABNORMAL LOW
CO2: 27
Calcium: 8.1 — ABNORMAL LOW
Creatinine, Ser: 0.71
GFR calc Af Amer: >60
GFR calc non Af Amer: >60
Sodium: 135
Total Protein: 5.1 — ABNORMAL LOW

## 2011-06-23 LAB — CK TOTAL AND CKMB (NOT AT ARMC)
CK, MB: 4.5 — ABNORMAL HIGH
Relative Index: 3.8 — ABNORMAL HIGH
Total CK: 119
Total CK: 67

## 2011-06-23 LAB — D-DIMER, QUANTITATIVE: D-Dimer, Quant: 3.35 — ABNORMAL HIGH

## 2011-06-23 LAB — TROPONIN I: Troponin I: 0.04

## 2011-06-23 LAB — B-NATRIURETIC PEPTIDE (CONVERTED LAB)
Pro B Natriuretic peptide (BNP): 203 — ABNORMAL HIGH
Pro B Natriuretic peptide (BNP): 211 — ABNORMAL HIGH

## 2011-06-23 LAB — LIPID PANEL: Cholesterol: 127

## 2011-06-23 LAB — CULTURE, BLOOD (ROUTINE X 2)

## 2011-12-06 DIAGNOSIS — I472 Ventricular tachycardia: Secondary | ICD-10-CM

## 2011-12-06 DIAGNOSIS — I5023 Acute on chronic systolic (congestive) heart failure: Secondary | ICD-10-CM

## 2012-01-17 ENCOUNTER — Encounter: Payer: Medicare Other | Admitting: Internal Medicine

## 2012-01-29 ENCOUNTER — Emergency Department (HOSPITAL_COMMUNITY)
Admission: EM | Admit: 2012-01-29 | Discharge: 2012-01-29 | Disposition: A | Discharge status: Home / Self Care | Payer: Medicare Other | Attending: Emergency Medicine | Admitting: Emergency Medicine

## 2012-01-29 ENCOUNTER — Encounter (HOSPITAL_COMMUNITY): Payer: Self-pay | Admitting: *Deleted

## 2012-01-29 DIAGNOSIS — F0390 Unspecified dementia without behavioral disturbance: Secondary | ICD-10-CM | POA: Insufficient documentation

## 2012-01-29 DIAGNOSIS — F603 Borderline personality disorder: Secondary | ICD-10-CM | POA: Insufficient documentation

## 2012-01-29 DIAGNOSIS — E538 Deficiency of other specified B group vitamins: Secondary | ICD-10-CM | POA: Insufficient documentation

## 2012-01-29 DIAGNOSIS — I10 Essential (primary) hypertension: Secondary | ICD-10-CM | POA: Insufficient documentation

## 2012-01-29 DIAGNOSIS — K589 Irritable bowel syndrome without diarrhea: Secondary | ICD-10-CM | POA: Insufficient documentation

## 2012-01-29 DIAGNOSIS — K219 Gastro-esophageal reflux disease without esophagitis: Secondary | ICD-10-CM | POA: Insufficient documentation

## 2012-01-29 DIAGNOSIS — H3530 Unspecified macular degeneration: Secondary | ICD-10-CM | POA: Insufficient documentation

## 2012-01-29 DIAGNOSIS — I4891 Unspecified atrial fibrillation: Secondary | ICD-10-CM | POA: Insufficient documentation

## 2012-01-29 NOTE — ED Notes (Signed)
Pt calm, cooperative, resting.  No aggressive behavior noted.  nad noted.

## 2012-01-29 NOTE — Discharge Instructions (Signed)
Take all of your medicines as usual. Call your doctor if needed for problems.

## 2012-01-29 NOTE — ED Provider Notes (Signed)
History   This chart was scribed for Flint Melter, MD by Clarita Crane. The patient was seen in room [facility]. Patient's care was started at 1156.    CSN: [id number]  Arrival date & time 01/29/12  1156   First MD Initiated Contact with Patient 01/29/12 1351      Chief Complaint  Patient presents with  . Aggressive Behavior    (Consider location/radiation/quality/duration/timing/severity/associated sxs/prior treatment) HPI A Level 5 Caveat applies due to Dementia [name] is a 78 y.o. female who presents to the Emergency Department after being BIB EMS from [facility] nursing facility to be evaluated for anxiety and aggressive behavior. Patient is unsure as to the reason she was brought to ED. Patient with h/o dementia, a-fib, hypokalemia, IBS, GERD.   Past Medical History  Diagnosis Date  . Syncope   . Mixed incontinence urge and stress (female)(female)   . Vitamin B12 deficiency   . Dementia   . Fistula of intestine   . Depression with anxiety   . Hypokalemia   . Atrial fibrillation   . HTN (hypertension)   . Mass of breast, right   . Pulmonary nodule     benign. pet scan 7/08  . Malaise and fatigue   . Arthritis   . Macular degeneration   . Cataract     NOS  . IBS (irritable bowel syndrome)   . Emphysema   . GERD (gastroesophageal reflux disease)   . Allergic rhinitis     Past Surgical History  Procedure Date  . Cesarean section   . Ruptured intestine 1990    not sure of hx - ?bowel obstruction  . Defirbrillator     cardioverter 05/22/07  . Cardiac catheterization     2008; nonobstructive CAD  . Icd     implant    Family History  Problem Relation Age of Onset  . Lung cancer      sibiling 74  . Colon cancer      child at 43 - deceased  . Diabetes      DM - child     History  Substance Use Topics  . Smoking status: Former Games developer  . Smokeless tobacco: Not on file  . Alcohol Use: No    OB History    Grav Para Term Preterm Abortions TAB SAB  Ect Mult Living                  Review of Systems  Unable to perform ROS    Allergies  Ciprofloxacin and Morphine  Home Medications   Current Outpatient Rx  Name Route Sig Dispense Refill  . ACETAMINOPHEN 500 MG PO TABS Oral Take 500 mg by mouth every 8 (eight) hours as needed. For pain    . AMIODARONE HCL 200 MG PO TABS Oral Take 200 mg by mouth daily.    . ASPIRIN 81 MG PO TBEC Oral Take 81 mg by mouth daily.      Marland Kitchen CARVEDILOL 6.25 MG PO TABS Oral Take 6.25 mg by mouth 2 (two) times daily with a meal.    . VITAMIN D 1000 UNITS PO TABS Oral Take 1,000 Units by mouth 2 (two) times daily.    . CYANOCOBALAMIN 1000 MCG/ML IJ SOLN Intramuscular Inject 1,000 mcg into the muscle every 30 (thirty) days.    Marland Kitchen MIRTAZAPINE 15 MG PO TABS Oral Take 15 mg by mouth at bedtime.    Marland Kitchen POTASSIUM CHLORIDE CRYS ER 20 MEQ PO TBCR Oral Take  40 mEq by mouth daily.     Marland Kitchen PRESCRIPTION MEDICATION Topical Apply 1 application topically as needed. For anxiety. Ativan Gel    . QUETIAPINE FUMARATE 25 MG PO TABS Oral Take 25 mg by mouth at bedtime.    Marland Kitchen NITROGLYCERIN 0.4 MG SL SUBL Sublingual Place 0.4 mg under the tongue every 5 (five) minutes as needed.        BP 105/47  Pulse 69  Temp(Src) 98.7 F (37.1 C) (Oral)  Resp 16  SpO2 94%  Physical Exam  Nursing note and vitals reviewed. Constitutional: She is oriented to person, place, and time. She appears well-developed and well-nourished.  HENT:  Head: Normocephalic and atraumatic.  Right Ear: External ear normal.  Left Ear: External ear normal.  Eyes: Conjunctivae and EOM are normal. Pupils are equal, round, and reactive to light.  Neck: Normal range of motion and phonation normal. Neck supple.  Cardiovascular: Normal rate, regular rhythm, normal heart sounds and intact distal pulses.  Exam reveals no gallop and no friction rub.   No murmur heard. Pulmonary/Chest: Effort normal and breath sounds normal. She has no wheezes. She has no rales. She  exhibits no bony tenderness.  Abdominal: Soft. Normal appearance. There is no tenderness.  Musculoskeletal: Normal range of motion.  Neurological: She is alert and oriented to person, place, and time. She has normal strength. No cranial nerve deficit or sensory deficit. She exhibits normal muscle tone. Coordination normal.       Oriented to self and place.   Skin: Skin is warm, dry and intact.  Psychiatric: Her behavior is normal.       No aggressive behavior. Mild confusion. Poor short-term memory.    ED Course  Procedures (including critical care time)  DIAGNOSTIC STUDIES: Oxygen Saturation is 94% on room air, adequate by my interpretation.    COORDINATION OF CARE: 1:56PM- Patient's accompanying records reviewed at bedside.    Labs Reviewed - No data to display No results found.   1. Dementia       MDM  Apparently, transient, aggressive behavior, completely resolved in the ED. She appears to be at her baseline mild dementia. She is back to her usual domicile.  Plan: Home Medications- usual; Home Treatments- no additional; Recommended follow up- PCP prn      I personally performed the services described in this documentation, which was scribed in my presence. The recorded information has been reviewed and considered.     Flint Melter, MD 01/29/12 [phone]

## 2012-01-29 NOTE — ED Notes (Signed)
Pt sent from [facility] for anxiety and combativeness. Pt arrived calm and cooperative. NAD.

## 2012-01-29 NOTE — ED Notes (Addendum)
Spoke with Crystal from [facility] - d/c instructions reviewed.  Crystal verbalized understanding.  EMS called for transport back to [facility].

## 2012-02-06 ENCOUNTER — Encounter: Payer: Self-pay | Admitting: Internal Medicine

## 2012-02-06 ENCOUNTER — Ambulatory Visit (INDEPENDENT_AMBULATORY_CARE_PROVIDER_SITE_OTHER): Payer: Medicare Other | Admitting: Internal Medicine

## 2012-02-06 VITALS — BP 128/72 | HR 84 | Resp 18 | Ht 65.0 in | Wt 161.0 lb

## 2012-02-06 DIAGNOSIS — Z95810 Presence of automatic (implantable) cardiac defibrillator: Secondary | ICD-10-CM

## 2012-02-06 DIAGNOSIS — I5022 Chronic systolic (congestive) heart failure: Secondary | ICD-10-CM

## 2012-02-06 DIAGNOSIS — I509 Heart failure, unspecified: Secondary | ICD-10-CM

## 2012-02-06 DIAGNOSIS — I4891 Unspecified atrial fibrillation: Secondary | ICD-10-CM

## 2012-02-06 LAB — ICD DEVICE OBSERVATION
AL IMPEDENCE ICD: 712.5 Ohm
ATRIAL PACING ICD: 15 pct
BAMS-0003: 70 {beats}/min
BATTERY VOLTAGE: 2.5718 V
HV IMPEDENCE: 50 Ohm
RV LEAD THRESHOLD: 1 V
TOT-0006: 20080910000000
TOT-0007: 6
TOT-0009: 4
TOT-0010: 35
TZAT-0001SLOWVT: 1
TZAT-0004SLOWVT: 8
TZAT-0012FASTVT: 200 ms
TZAT-0012SLOWVT: 200 ms
TZAT-0013FASTVT: 1
TZAT-0013SLOWVT: 4
TZAT-0018FASTVT: NEGATIVE
TZAT-0020SLOWVT: 1 ms
TZON-0003FASTVT: 300 ms
TZON-0005FASTVT: 6
TZON-0005SLOWVT: 6
TZST-0001FASTVT: 2
TZST-0001FASTVT: 3
TZST-0001FASTVT: 5
TZST-0001SLOWVT: 4
TZST-0001SLOWVT: 5
TZST-0003FASTVT: 36 J
TZST-0003FASTVT: 36 J
TZST-0003FASTVT: 36 J
TZST-0003SLOWVT: 15 J
TZST-0003SLOWVT: 25 J

## 2012-02-06 NOTE — Progress Notes (Signed)
HPI [name] returns today for followup. [name] is a pleasant 78 yo woman with an NICM, chronic class 2 CHF, s/p ICD implant. [name] has had multiple ICD lead dislodgements in the past but has been stable since her last one. [name] denies chest pain or sob. No syncope. [name] denies any ICD shock. No peripheral edema. [name] has chronic dyspnea which is multi-factorial. Allergies  Allergen Reactions  . Ciprofloxacin     REACTION: Tolerated single ED dose but told in past not to take.  Marland Kitchen Morphine     REACTION: Rash     Current Outpatient Prescriptions  Medication Sig Dispense Refill  . acetaminophen (TYLENOL) 500 MG tablet Take 500 mg by mouth every 8 (eight) hours as needed. For pain      . amiodarone (PACERONE) 200 MG tablet Take 200 mg by mouth daily.      Marland Kitchen aspirin 81 MG EC tablet Take 81 mg by mouth daily.        . carvedilol (COREG) 6.25 MG tablet Take 6.25 mg by mouth 2 (two) times daily with a meal.      . cholecalciferol (VITAMIN D) 1000 UNITS tablet Take 1,000 Units by mouth 2 (two) times daily.      . cyanocobalamin (,VITAMIN B-12,) 1000 MCG/ML injection Inject 1,000 mcg into the muscle every 30 (thirty) days.      . mirtazapine (REMERON) 15 MG tablet Take 15 mg by mouth at bedtime.      . nitroGLYCERIN (NITROSTAT) 0.4 MG SL tablet Place 0.4 mg under the tongue every 5 (five) minutes as needed.        . potassium chloride SA (K-DUR,KLOR-CON) 20 MEQ tablet Take 40 mEq by mouth daily.       Marland Kitchen PRESCRIPTION MEDICATION Apply 1 application topically as needed. For anxiety. Ativan Gel      . QUEtiapine (SEROQUEL) 25 MG tablet Take 25 mg by mouth at bedtime.         Past Medical History  Diagnosis Date  . Syncope   . Mixed incontinence urge and stress (female)(female)   . Vitamin B12 deficiency   . Dementia   . Fistula of intestine   . Depression with anxiety   . Hypokalemia   . Atrial fibrillation   . HTN (hypertension)   . Mass of breast, right   . Pulmonary nodule     benign. pet scan  7/08  . Malaise and fatigue   . Arthritis   . Macular degeneration   . Cataract     NOS  . IBS (irritable bowel syndrome)   . Emphysema   . GERD (gastroesophageal reflux disease)   . Allergic rhinitis     ROS:   All systems reviewed and negative except as noted in the HPI.   Past Surgical History  Procedure Date  . Cesarean section   . Ruptured intestine 1990    not sure of hx - ?bowel obstruction  . Defirbrillator     cardioverter 05/22/07  . Cardiac catheterization     2008; nonobstructive CAD  . Icd     implant     Family History  Problem Relation Age of Onset  . Lung cancer      sibiling 74  . Colon cancer      child at 64 - deceased  . Diabetes      DM - child      History   Social History  . Marital Status: Widowed    Spouse  Name: N/A    Number of Children: N/A  . Years of Education: N/A   Occupational History  . Not on file.   Social History Main Topics  . Smoking status: Former Games developer  . Smokeless tobacco: Not on file  . Alcohol Use: No  . Drug Use: No  . Sexually Active: Not on file   Other Topics Concern  . Not on file   Social History Narrative   Widowed; lives with children. Retired.      BP 128/72  Pulse 84  Resp 18  Ht 5\' 5"  (1.651 m)  Wt 161 lb (73.029 kg)  BMI 26.79 kg/m2  Physical Exam:  Frail, elderly appearing woman, NAD HEENT: Unremarkable Neck:  No JVD, no thyromegally Lungs:  Clear with no wheezes, rales, or rhonchi HEART:  Regular rate rhythm, no murmurs, no rubs, no clicks Abd:  soft, positive bowel sounds, no organomegally, no rebound, no guarding Ext:  2 plus pulses, no edema, no cyanosis, no clubbing Skin:  No rashes no nodules Neuro:  CN II through XII intact, motor grossly intact  DEVICE  Normal device function.  See PaceArt for details.   Assess/Plan:

## 2012-02-06 NOTE — Patient Instructions (Signed)
**Note De-identified  Obfuscation** Your physician recommends that you schedule a follow-up appointment in: 3 months for device check and in 1 year with Dr. Taylor  

## 2012-02-07 DIAGNOSIS — I5022 Chronic systolic (congestive) heart failure: Secondary | ICD-10-CM | POA: Insufficient documentation

## 2012-02-07 DIAGNOSIS — Z95810 Presence of automatic (implantable) cardiac defibrillator: Secondary | ICD-10-CM | POA: Insufficient documentation

## 2012-02-07 NOTE — Assessment & Plan Note (Signed)
Her symptoms are class 2. She will continue her current meds and maintain a low sodium diet. 

## 2012-02-07 NOTE — Assessment & Plan Note (Signed)
She appears to be maintaining NSR. Continue amiodarone. Not a coumadin candidate.

## 2012-02-07 NOTE — Assessment & Plan Note (Signed)
Her device is working normally. Continue followup in several months. 

## 2014-03-26 ENCOUNTER — Other Ambulatory Visit: Payer: Medicare Other

## 2015-06-02 NOTE — Patient Outreach (Signed)
[place] [facility]) Care Management  06/02/2015  [name] 02-May-1927 [id number]   Referral from [employer] Tier 2 List, assigned Jon Billings, RN to outreach.  Thanks, Ronnell Freshwater. [place], [place] Assistant Phone: [phone] Fax: [phone]

## 2015-06-04 ENCOUNTER — Other Ambulatory Visit: Payer: Self-pay

## 2015-06-04 NOTE — Patient Outreach (Signed)
[place] [facility]) Care Management  06/04/2015  [name] 08/14/1927 [id number]  First telephone call to patient regarding Tier 2 referral.  Phone number is to [facility].  Found that patient is not inpatient there. Called Emergency contact [name] @ [phone].  No answer.  Called next contact Tiro Case @ [phone] busy signal received.    Plan: RN Health Coach will attempt Emergency contact within 1-2 weeks to inquire about patient status.  Jone Baseman, RN, MSN [place] [phone]

## 2015-06-09 ENCOUNTER — Other Ambulatory Visit: Payer: Self-pay

## 2015-06-09 NOTE — Patient Outreach (Signed)
[place] [facility]) Care Management  06/09/2015  [name] [birth year] [id number]   Second telephone call to patient regarding tier 2 referral.  No answer at [name] number [phone].    Plan: RN Health Coach will attempt telephone outreach within 1-2 weeks.    Jone Baseman, RN, MSN [place] [phone]

## 2015-06-15 ENCOUNTER — Other Ambulatory Visit: Payer: Self-pay

## 2015-06-15 NOTE — Patient Outreach (Signed)
[place] [facility]) Care Management  06/15/2015  [name] 10-13-1926 [id number]   Third telephone call to patient regarding tier 2 referral. No answer at [name] number [phone].  Plan: RN will send outreach letter to attempt contact.    Jone Baseman, RN, MSN [name] [phone]

## 2015-07-01 NOTE — Patient Outreach (Signed)
[facility]) Care Management  07/01/2015  [name] August 01, 1927 [id number]   No response from patient after 3 outreach calls and letter.  Plan: RN Health Coach will forward patient information to Lurline Del for case closure.    Jone Baseman, RN, MSN [place] [phone]

## 2015-07-05 NOTE — Patient Outreach (Signed)
[place] [facility]) Care Management  07/05/2015  TUYET BADER 1927/03/09 [id number]   Notification from Jon Billings, RN to close case due to unable to contact patient for [place] Management services.  Thanks, Ronnell Freshwater. [place], [place] Assistant Phone: [phone] Fax: [phone]

## 2017-04-17 ENCOUNTER — Encounter (HOSPITAL_COMMUNITY): Payer: Self-pay | Admitting: General Practice

## 2017-04-17 ENCOUNTER — Inpatient Hospital Stay (HOSPITAL_COMMUNITY)
Admission: AD | Admit: 2017-04-17 | Discharge: 2017-04-20 | DRG: 536 | Disposition: A | Discharge status: Hospice | Payer: Medicare Other | Source: Other Acute Inpatient Hospital | Attending: Internal Medicine | Admitting: Internal Medicine

## 2017-04-17 DIAGNOSIS — K589 Irritable bowel syndrome without diarrhea: Secondary | ICD-10-CM | POA: Diagnosis present

## 2017-04-17 DIAGNOSIS — F0280 Dementia in other diseases classified elsewhere without behavioral disturbance: Secondary | ICD-10-CM | POA: Diagnosis present

## 2017-04-17 DIAGNOSIS — Z515 Encounter for palliative care: Secondary | ICD-10-CM | POA: Diagnosis not present

## 2017-04-17 DIAGNOSIS — J439 Emphysema, unspecified: Secondary | ICD-10-CM | POA: Diagnosis present

## 2017-04-17 DIAGNOSIS — E43 Unspecified severe protein-calorie malnutrition: Secondary | ICD-10-CM | POA: Diagnosis not present

## 2017-04-17 DIAGNOSIS — S72002A Fracture of unspecified part of neck of left femur, initial encounter for closed fracture: Secondary | ICD-10-CM | POA: Diagnosis not present

## 2017-04-17 DIAGNOSIS — Y92129 Unspecified place in nursing home as the place of occurrence of the external cause: Secondary | ICD-10-CM

## 2017-04-17 DIAGNOSIS — I429 Cardiomyopathy, unspecified: Secondary | ICD-10-CM | POA: Diagnosis present

## 2017-04-17 DIAGNOSIS — Z79899 Other long term (current) drug therapy: Secondary | ICD-10-CM

## 2017-04-17 DIAGNOSIS — G309 Alzheimer's disease, unspecified: Secondary | ICD-10-CM | POA: Diagnosis present

## 2017-04-17 DIAGNOSIS — Z66 Do not resuscitate: Secondary | ICD-10-CM | POA: Diagnosis present

## 2017-04-17 DIAGNOSIS — S72145D Nondisplaced intertrochanteric fracture of left femur, subsequent encounter for closed fracture with routine healing: Secondary | ICD-10-CM | POA: Diagnosis not present

## 2017-04-17 DIAGNOSIS — G301 Alzheimer's disease with late onset: Secondary | ICD-10-CM | POA: Diagnosis not present

## 2017-04-17 DIAGNOSIS — F329 Major depressive disorder, single episode, unspecified: Secondary | ICD-10-CM | POA: Diagnosis not present

## 2017-04-17 DIAGNOSIS — Z95810 Presence of automatic (implantable) cardiac defibrillator: Secondary | ICD-10-CM

## 2017-04-17 DIAGNOSIS — Z883 Allergy status to other anti-infective agents status: Secondary | ICD-10-CM | POA: Diagnosis not present

## 2017-04-17 DIAGNOSIS — I5022 Chronic systolic (congestive) heart failure: Secondary | ICD-10-CM | POA: Diagnosis present

## 2017-04-17 DIAGNOSIS — Z885 Allergy status to narcotic agent status: Secondary | ICD-10-CM

## 2017-04-17 DIAGNOSIS — N3946 Mixed incontinence: Secondary | ICD-10-CM | POA: Diagnosis not present

## 2017-04-17 DIAGNOSIS — Z7982 Long term (current) use of aspirin: Secondary | ICD-10-CM | POA: Diagnosis not present

## 2017-04-17 DIAGNOSIS — Z87891 Personal history of nicotine dependence: Secondary | ICD-10-CM

## 2017-04-17 DIAGNOSIS — I4891 Unspecified atrial fibrillation: Secondary | ICD-10-CM | POA: Diagnosis present

## 2017-04-17 DIAGNOSIS — I110 Hypertensive heart disease with heart failure: Secondary | ICD-10-CM | POA: Diagnosis present

## 2017-04-17 DIAGNOSIS — S72142A Displaced intertrochanteric fracture of left femur, initial encounter for closed fracture: Principal | ICD-10-CM | POA: Diagnosis present

## 2017-04-17 DIAGNOSIS — F419 Anxiety disorder, unspecified: Secondary | ICD-10-CM | POA: Diagnosis present

## 2017-04-17 DIAGNOSIS — I2510 Atherosclerotic heart disease of native coronary artery without angina pectoris: Secondary | ICD-10-CM | POA: Diagnosis present

## 2017-04-17 DIAGNOSIS — W19XXXA Unspecified fall, initial encounter: Secondary | ICD-10-CM | POA: Diagnosis not present

## 2017-04-17 DIAGNOSIS — K219 Gastro-esophageal reflux disease without esophagitis: Secondary | ICD-10-CM | POA: Diagnosis present

## 2017-04-17 DIAGNOSIS — Z7189 Other specified counseling: Secondary | ICD-10-CM

## 2017-04-17 DIAGNOSIS — S329XXA Fracture of unspecified parts of lumbosacral spine and pelvis, initial encounter for closed fracture: Secondary | ICD-10-CM | POA: Insufficient documentation

## 2017-04-17 DIAGNOSIS — S72002S Fracture of unspecified part of neck of left femur, sequela: Secondary | ICD-10-CM | POA: Diagnosis not present

## 2017-04-17 DIAGNOSIS — S72009A Fracture of unspecified part of neck of unspecified femur, initial encounter for closed fracture: Secondary | ICD-10-CM | POA: Diagnosis present

## 2017-04-17 LAB — URINALYSIS, ROUTINE W REFLEX MICROSCOPIC
Bilirubin Urine: NEGATIVE
Glucose, UA: NEGATIVE mg/dL
Ketones, ur: 20 mg/dL — AB
Nitrite: POSITIVE — AB
PH: 7 (ref 5.0–8.0)
Protein, ur: 30 mg/dL — AB
SPECIFIC GRAVITY, URINE: 1.014 (ref 1.005–1.030)
SQUAMOUS EPITHELIAL / LPF: NONE SEEN

## 2017-04-17 MED ORDER — AMIODARONE HCL 200 MG PO TABS: 200.0000 mg | ORAL_TABLET | Freq: Every day | ORAL | Status: DC

## 2017-04-17 MED ORDER — ASPIRIN 81 MG PO TBEC: 81.0000 mg | DELAYED_RELEASE_TABLET | Freq: Every day | ORAL | Status: DC

## 2017-04-17 MED ORDER — VITAMIN D 1000 UNITS PO TABS: 1000.0000 [IU] | ORAL_TABLET | Freq: Two times a day (BID) | ORAL | Status: DC

## 2017-04-17 MED ORDER — DIVALPROEX SODIUM 125 MG PO CSDR
125.0000 mg | DELAYED_RELEASE_CAPSULE | Freq: Two times a day (BID) | ORAL | Status: DC
  Filled 2017-04-17 (×5): qty 1

## 2017-04-17 MED ORDER — NITROGLYCERIN 0.4 MG SL SUBL
0.4000 mg | SUBLINGUAL_TABLET | SUBLINGUAL | Status: DC | PRN
Start: 2017-04-17 — End: 2017-04-20

## 2017-04-17 MED ORDER — LEVOTHYROXINE SODIUM 50 MCG PO TABS: 50.0000 ug | ORAL_TABLET | Freq: Every day | ORAL | Status: DC

## 2017-04-17 MED ORDER — MORPHINE SULFATE (PF) 4 MG/ML IV SOLN: 0.5000 mg | INTRAVENOUS | Status: DC | PRN

## 2017-04-17 MED ORDER — ENOXAPARIN SODIUM 30 MG/0.3ML ~~LOC~~ SOLN: 30.0000 mg | SUBCUTANEOUS | Status: DC

## 2017-04-17 MED ORDER — LORAZEPAM 0.5 MG PO TABS: 0.5000 mg | ORAL_TABLET | Freq: Two times a day (BID) | ORAL | Status: DC | PRN

## 2017-04-17 MED ORDER — FAMOTIDINE IN NACL 20-0.9 MG/50ML-% IV SOLN
20.0000 mg | Freq: Two times a day (BID) | INTRAVENOUS | Status: DC
  Filled 2017-04-17 (×4): qty 50

## 2017-04-17 MED ORDER — MIRTAZAPINE 15 MG PO TABS
15.0000 mg | ORAL_TABLET | Freq: Every day | ORAL | Status: DC
  Filled 2017-04-17 (×2): qty 1

## 2017-04-17 MED ORDER — CARVEDILOL 6.25 MG PO TABS: 6.2500 mg | ORAL_TABLET | Freq: Two times a day (BID) | ORAL | Status: DC

## 2017-04-17 MED ORDER — HYDROMORPHONE HCL 1 MG/ML IJ SOLN: 0.5000 mg | INTRAMUSCULAR | Status: DC | PRN

## 2017-04-17 MED ORDER — HYDROCODONE-ACETAMINOPHEN 5-325 MG PO TABS
1.0000 | ORAL_TABLET | Freq: Four times a day (QID) | ORAL | Status: DC | PRN
  Filled 2017-04-17: qty 2

## 2017-04-17 MED ORDER — POLYETHYLENE GLYCOL 3350 17 G PO PACK: 17.0000 g | PACK | Freq: Every day | ORAL | Status: DC | PRN

## 2017-04-17 NOTE — Progress Notes (Signed)
Pt refused telemetry, SCD and IV line, her daughter at the bedside, MD aware, urinalysis collected, requested low bed and sitter or AVA system, pt history of multiple falls, fall precaution initiated.

## 2017-04-17 NOTE — H&P (Addendum)
History and Physical    [name] KDT:[id number] DOB: 1926/10/30 DOA: 04/17/2017  PCP: No primary care provider on file.  Patient coming [place] care  Chief Complaint:hip fracture and need orthopedic consult.  HPI: [name] is a 83 y.o. female with medical history significant of hypertension, nonischemic cardiomyopathy, chronic systolic congestive heart failure, Alzheimer dementia currently on memory care unit, acid reflux, emphysema, atrial fibrillation as per medical chart transferred from outside hospital for the evaluation of the fracture and requiring orthopedic consult. As per patient's daughter at bedside, she received a call from memory care unit regarding patient's fall around p.m. last night and brought to outside hospital when x-ray showed a fracture. Patient had a fracture few months ago as well. Orthopedics Dr. Erlinda Hong was contacted and patient was transferred to this facility for further evaluation. Patient has severe Alzheimer dementia therefore unable to obtain history from her.  At outside hospital labs including CBC BMP UA x-ray of chest and hip  was done. Transfer here for further evaluation.  Review of Systems: As per HPI otherwise 10 point review of systems negative.    Past Medical History:  Diagnosis Date  . Allergic rhinitis   . Arthritis   . Atrial fibrillation ([place])   . Cataract    NOS  . Dementia   . Depression with anxiety   . Emphysema   . Fistula of intestine   . GERD (gastroesophageal reflux disease)   . HTN (hypertension)   . Hypokalemia   . IBS (irritable bowel syndrome)   . Macular degeneration   . Malaise and fatigue   . Mass of breast, right   . Mixed incontinence urge and stress (female)(female)   . Pulmonary nodule    benign. pet scan 7/08  . Syncope   . Vitamin B12 deficiency     Past Surgical History:  Procedure Laterality Date  . CARDIAC CATHETERIZATION     2008; nonobstructive CAD  . cesarean section    .  defirbrillator     cardioverter 05/22/07  . icd     implant  . ruptured intestine  1990   not sure of hx - ?bowel obstruction    Social history: reports that she has quit smoking. She does not have any smokeless tobacco history on file. She reports that she does not drink alcohol or use drugs.  Allergies  Allergen Reactions  . Ciprofloxacin     REACTION: Tolerated single ED dose but told in past not to take.  Marland Kitchen Morphine     REACTION: Rash    Family History  Problem Relation Age of Onset  . Lung cancer Unknown        sibiling 74  . Colon cancer Unknown        child at 62 - deceased  . Diabetes Unknown        DM - child      Prior to Admission medications   Medication Sig Start Date End Date Taking? Authorizing Provider  acetaminophen (TYLENOL) 500 MG tablet Take 500 mg by mouth every 8 (eight) hours as needed. For pain    [provider]  amiodarone (PACERONE) 200 MG tablet Take 200 mg by mouth daily.    [provider]  aspirin 81 MG EC tablet Take 81 mg by mouth daily.      [provider]  carvedilol (COREG) 6.25 MG tablet Take 6.25 mg by mouth 2 (two) times daily with a meal.  [provider]  cholecalciferol (VITAMIN D) 1000 UNITS tablet Take 1,000 Units by mouth 2 (two) times daily.    [provider]  cyanocobalamin (,VITAMIN B-12,) 1000 MCG/ML injection Inject 1,000 mcg into the muscle every 30 (thirty) days.    [provider]  mirtazapine (REMERON) 15 MG tablet Take 15 mg by mouth at bedtime.    [provider]  nitroGLYCERIN (NITROSTAT) 0.4 MG SL tablet Place 0.4 mg under the tongue every 5 (five) minutes as needed.      [provider]  potassium chloride SA (K-DUR,KLOR-CON) 20 MEQ tablet Take 40 mEq by mouth daily.     [provider]  PRESCRIPTION MEDICATION Apply 1 application topically as needed. For anxiety. Ativan Gel    [provider]  QUEtiapine (SEROQUEL) 25 MG  tablet Take 25 mg by mouth at bedtime.    [provider]    Physical Exam: Vitals:   04/17/17 1705  BP: 131/68  Pulse: 67  Resp: 18  Temp: 98.6 F (37 C)  TempSrc: Oral  SpO2: 95%  Weight: 52.2 kg (115 lb)      Constitutional: Demented elderly female lying on bed comfortable, not in distress Vitals:   04/17/17 1705  BP: 131/68  Pulse: 67  Resp: 18  Temp: 98.6 F (37 C)  TempSrc: Oral  SpO2: 95%  Weight: 52.2 kg (115 lb)   Eyes: PERRL Neck: normal, supple, Respiratory: clear to auscultation bilaterally, no wheezing, no crackles. Normal respiratory effort. No accessory muscle use.  Cardiovascular: Regular rate and rhythm,  No extremity edema.   Abdomen: Soft, nontender, bowel sounds positive. Musculoskeletal: No external wound or bruises. Skin: no rashes, lesions, ulcers. No induration Neurologic: Alert awake but not oriented Psychiatric: Unable to assess because of severe dementia.   Labs on Admission: I have personally reviewed following labs and imaging studies  CBC: No results for input(s): WBC, NEUTROABS, HGB, HCT, MCV, PLT in the last 168 hours. Basic Metabolic Panel: No results for input(s): NA, K, CL, CO2, GLUCOSE, BUN, CREATININE, CALCIUM, MG, PHOS in the last 168 hours. GFR: CrCl cannot be calculated (Patient's most recent lab result is older than the maximum 21 days allowed.). Liver Function Tests: No results for input(s): AST, ALT, ALKPHOS, BILITOT, PROT, ALBUMIN in the last 168 hours. No results for input(s): LIPASE, AMYLASE in the last 168 hours. No results for input(s): AMMONIA in the last 168 hours. Coagulation Profile: No results for input(s): INR, PROTIME in the last 168 hours. Cardiac Enzymes: No results for input(s): CKTOTAL, CKMB, CKMBINDEX, TROPONINI in the last 168 hours. BNP (last 3 results) No results for input(s): PROBNP in the last 8760 hours. HbA1C: No results for input(s): HGBA1C in the last 72 hours. CBG: No results  for input(s): GLUCAP in the last 168 hours. Lipid Profile: No results for input(s): CHOL, HDL, LDLCALC, TRIG, CHOLHDL, LDLDIRECT in the last 72 hours. Thyroid Function Tests: No results for input(s): TSH, T4TOTAL, FREET4, T3FREE, THYROIDAB in the last 72 hours. Anemia Panel: No results for input(s): VITAMINB12, FOLATE, FERRITIN, TIBC, IRON, RETICCTPCT in the last 72 hours. Urine analysis:    Component Value Date/Time   COLORURINE yellow 11/13/2008 1512   APPEARANCEUR Clear 11/13/2008 1512   LABSPEC 1.025 11/13/2008 1512   PHURINE 5.0 11/13/2008 1512   GLUCOSEU NEGATIVE 11/09/2008 1517   HGBUR negative 11/13/2008 1512   BILIRUBINUR negative 11/13/2008 1512   KETONESUR 15 (A) 11/09/2008 1517   PROTEINUR NEGATIVE 11/09/2008 1517   UROBILINOGEN 0.2  11/13/2008 1512   NITRITE negative 11/13/2008 1512   LEUKOCYTESUR LARGE (A) 11/09/2008 1517   Sepsis Labs: !!!!!!!!!!!!!!!!!!!!!!!!!!!!!!!!!!!!!!!!!!!! @LABRCNTIP (procalcitonin:4,lacticidven:4) )No results found for this or any previous visit (from the past 240 hour(s)).   Radiological Exams on Admission: No results found.   Assessment/Plan Active Problems:   Pelvic fracture (HCC)   Hip fracture (HCC)  # Acute comminuted intertrochanteric fracture of the left femur after the fall: -. X-ray done at outside facility consistent with a fracture. I discussed with Dr. Erlinda Hong from orthopedics for patient's arrival to the hospital. He requested further cardiac workup and evaluation before surgery. I will start diet today and keep nothing by mouth from midnight. Start Pepcid. -Check basic lab including CBC, CMP, PT/INR, PTT, baseline EKG, echocardiogram. -Cardiology consult requested by secure Epic message.  -Patient looked comfortable. Order pain medication as needed. Bowel regimen.  At outside hospital, UA normal, CBC BMP unremarkable. Chest x-ray with mild bibasilar atelectasis or scaring. No other active disease, mild cardiomegaly.  I had a  long discussion with the patient's daughter who is also healthcare power of attorney. Given patient's severe Alzheimer dementia and known cardiac history, unknown if patient will benefit from surgical intervention. Palliative care consult requested as per family's request. Of course, family will like to discuss with orthopedics and other consultations regarding further testing and risk/benefit of surgery. Code status discussed with the patient's daughter and requested DO NOT RESUSCITATE. DO NOT RESUSCITATE order placed.  # History of nonischemic cardiomyopathy, chronic systolic congestive heart failure: Patient looks euvolemic on physical exam. Her vitals are stable. -Order EKG, echocardiogram -Preop evaluation consult for cardiology  # h/o p Afib as per chart: f/u ekg. Continue amiodarone, ASA  # Severe Alzheimer's dementia: Patient lives in memory care unit and she is not oriented. She has had a fall  few months ago when she had peri-orbital bruises as per patient's daughter. I order PT OT, social worker evaluation.  #Hypertension: Continue Coreg. Monitor blood pressure closely. Pain management.  I reviewed the patient's medical record from outside hospital and ordered medication including Depakote, Synthroid, mirtazapine and Ativan. Continue supportive care.  DVT prophylaxis: lovenox sq Code Status:DNR  Family Communication: Discussed with the patient's daughter Disposition Plan: Admitted now Consults called: Orthopedics, consult request sent to cardiology, palliative care  Admission status: Inpatient   Dron Tanna Furry MD [facility] Pager [phone]  If 7PM-7AM, please contact night-coverage [email or web address] Password [email or web address]  04/17/2017, 5:55 PM

## 2017-04-17 NOTE — Progress Notes (Signed)
Pt new admit alert and very confused with left hip fracture, pulled IV line 2x and trying to pulled her foley cath, bruise on left posterior arm and right arm, on bed alarm, waiting for MD for orders.

## 2017-04-18 ENCOUNTER — Encounter (HOSPITAL_COMMUNITY)
Admission: AD | Disposition: A | Discharge status: Hospice | Payer: Self-pay | Source: Other Acute Inpatient Hospital | Attending: Internal Medicine

## 2017-04-18 ENCOUNTER — Encounter (HOSPITAL_COMMUNITY): Payer: Self-pay | Admitting: Orthopedic Surgery

## 2017-04-18 DIAGNOSIS — Z7189 Other specified counseling: Secondary | ICD-10-CM

## 2017-04-18 DIAGNOSIS — F0280 Dementia in other diseases classified elsewhere without behavioral disturbance: Secondary | ICD-10-CM

## 2017-04-18 DIAGNOSIS — S72142A Displaced intertrochanteric fracture of left femur, initial encounter for closed fracture: Secondary | ICD-10-CM

## 2017-04-18 DIAGNOSIS — S72002S Fracture of unspecified part of neck of left femur, sequela: Secondary | ICD-10-CM

## 2017-04-18 DIAGNOSIS — G301 Alzheimer's disease with late onset: Secondary | ICD-10-CM

## 2017-04-18 DIAGNOSIS — Z515 Encounter for palliative care: Secondary | ICD-10-CM

## 2017-04-18 LAB — BASIC METABOLIC PANEL
ANION GAP: 12 (ref 5–15)
BUN: 21 mg/dL — ABNORMAL HIGH (ref 6–20)
CALCIUM: 8.8 mg/dL — AB (ref 8.9–10.3)
CHLORIDE: 102 mmol/L (ref 101–111)
CO2: 25 mmol/L (ref 22–32)
Creatinine, Ser: 1.05 mg/dL — ABNORMAL HIGH (ref 0.44–1.00)
GFR calc Af Amer: 53 mL/min — ABNORMAL LOW (ref >60)
GFR calc non Af Amer: 46 mL/min — ABNORMAL LOW (ref >60)
GLUCOSE: 109 mg/dL — AB (ref 65–99)
Potassium: 4.6 mmol/L (ref 3.5–5.1)
Sodium: 139 mmol/L (ref 135–145)

## 2017-04-18 LAB — CBC
HEMATOCRIT: 40.6 % (ref 36.0–46.0)
Hemoglobin: 13.2 g/dL (ref 12.0–15.0)
MCH: 30.1 pg (ref 26.0–34.0)
MCHC: 32.5 g/dL (ref 30.0–36.0)
MCV: 92.5 fL (ref 78.0–100.0)
Platelets: 296 10*3/uL (ref 150–400)
RBC: 4.39 MIL/uL (ref 3.87–5.11)
RDW: 17.1 % — AB (ref 11.5–15.5)
WBC: 11.9 10*3/uL — AB (ref 4.0–10.5)

## 2017-04-18 LAB — GLUCOSE, CAPILLARY: GLUCOSE-CAPILLARY: 119 mg/dL — AB (ref 65–99)

## 2017-04-18 SURGERY — FIXATION, FRACTURE, INTERTROCHANTERIC, WITH INTRAMEDULLARY ROD
Anesthesia: General | Laterality: Left

## 2017-04-18 MED ORDER — OXYCODONE HCL 5 MG/5ML PO SOLN: 5.0000 mg | ORAL | Status: DC | PRN

## 2017-04-18 MED ORDER — HALOPERIDOL 1 MG PO TABS: 0.5000 mg | ORAL_TABLET | ORAL | Status: DC | PRN

## 2017-04-18 MED ORDER — LIDOCAINE 5 % EX PTCH
1.0000 | MEDICATED_PATCH | CUTANEOUS | Status: DC
  Administered 2017-04-18: 1 via TRANSDERMAL
  Filled 2017-04-18: qty 1

## 2017-04-18 MED ORDER — HALOPERIDOL LACTATE 2 MG/ML PO CONC
0.5000 mg | ORAL | Status: DC | PRN
  Administered 2017-04-20: 0.5 mg via SUBLINGUAL
  Filled 2017-04-18 (×2): qty 0.3

## 2017-04-18 NOTE — Consult Note (Addendum)
ORTHOPAEDIC CONSULTATION  REQUESTING PHYSICIAN: Louellen Molder, MD  Chief Complaint: Left intertroch hip fracture  HPI: [name] is a 83 y.o. female who presents with left hip fracture s/p unwitnessed fall PTA.  The patient has severe dementia.  No family is available.  Past Medical History:  Diagnosis Date  . Allergic rhinitis   . Arthritis   . Atrial fibrillation ([place])   . Cataract    NOS  . Dementia   . Depression with anxiety   . Emphysema   . Fistula of intestine   . GERD (gastroesophageal reflux disease)   . HTN (hypertension)   . Hypokalemia   . IBS (irritable bowel syndrome)   . Macular degeneration   . Malaise and fatigue   . Mass of breast, right   . Mixed incontinence urge and stress (female)(female)   . Pulmonary nodule    benign. pet scan 7/08  . Syncope   . Vitamin B12 deficiency    Past Surgical History:  Procedure Laterality Date  . ABDOMINAL HYSTERECTOMY    . CARDIAC CATHETERIZATION     2008; nonobstructive CAD  . cesarean section    . COLOSTOMY CLOSURE    . defirbrillator     cardioverter 05/22/07  . icd     implant  . ILEOSTOMY CLOSURE  1980's  . INSERTION OF MESH    . ruptured intestine  1990   not sure of hx - ?bowel obstruction   Social History   Social History  . Marital status: Widowed    Spouse name: N/A  . Number of children: N/A  . Years of education: N/A   Social History Main Topics  . Smoking status: Former Research scientist (life sciences)  . Smokeless tobacco: Never Used  . Alcohol use No  . Drug use: No  . Sexual activity: Not Asked   Other Topics Concern  . None   Social History Narrative   Widowed; lives with children. Retired.    Family History  Problem Relation Age of Onset  . Lung cancer Unknown        sibiling 74  . Colon cancer Unknown        child at 10 - deceased  . Diabetes Unknown        DM - child    Allergies  Allergen Reactions  . Ciprofloxacin Other (See Comments)    Tolerated single ED dose but told in  past not to take  . Quinolones     Reaction (?) This is per paperwork from [employer]  . Morphine Rash    REAC   Prior to Admission medications   Medication Sig Start Date End Date Taking? Authorizing Provider  acetaminophen (MAPAP) 500 MG tablet Take 500-1,000 mg by mouth See admin instructions. 500 mg every 8 hours AS NEEDED for pain and 1,000 mg every 6 hours AS NEEDED for minor discomfort or headaches (contact MD if headache lasts longer than 24 hrs)   Yes [provider]  acetaminophen (TYLENOL) 325 MG tablet Take 650 mg by mouth 2 (two) times daily. MORNING AND BEDTIME   Yes [provider]  amiodarone (PACERONE) 200 MG tablet Take 200 mg by mouth daily.   Yes [provider]  aspirin 81 MG EC tablet Take 81 mg by mouth daily.     Yes [provider]  carvedilol (COREG) 12.5 MG tablet Take 12.5 mg by mouth 2 (two) times daily.   Yes [provider]  Cholecalciferol (VITAMIN D3) 10000 units  TABS Take 1,000 Units by mouth 2 (two) times daily.   Yes [provider]  divalproex (DEPAKOTE SPRINKLE) 125 MG capsule Take 125-250 mg by mouth See admin instructions. 125 mg in the morning and 250 mg at bedtime   Yes [provider]  levothyroxine (SYNTHROID, LEVOTHROID) 50 MCG tablet Take 50 mcg by mouth daily.   Yes [provider]  loperamide (IMODIUM A-D) 2 MG tablet Take 2 mg by mouth See admin instructions. WITH EACH LOOSE STOOL/MAY TAKE UP TO 8 DOSES IN 24 HOURS   Yes [provider]  LORazepam (ATIVAN) 0.5 MG tablet Take 0.25 mg by mouth 2 (two) times daily as needed (for acute agitation, anxiety, and behavioral issue).   Yes [provider]  mirtazapine (REMERON) 15 MG tablet Take 22.5 mg by mouth at bedtime.    Yes [provider]  nitroGLYCERIN (NITROSTAT) 0.4 MG SL tablet Place 0.4 mg under the tongue every 5 (five) minutes x 3 doses as needed for chest pain. IF NO RELIEF, CALL  9-1-1   Yes [provider]  NONFORMULARY OR COMPOUNDED ITEM Lorazepam Gel 0.5 mg/ml: Apply 1 ml topically to the skin two times a day as needed for agitation or anxiety   Yes [provider]  nystatin (NYSTATIN) powder Apply topically 2 (two) times daily. AFTER CLEANSING MID-ABDOMINAL WOUND/COVER WITH GAUZE DRESSING AFTER APPLYING POWDER   Yes [provider]  nystatin cream (MYCOSTATIN) Apply 1 application topically 2 (two) times daily. APPLY TO GROIN AND ABDOMINAL SKIN FOLDS AND MAY APPLY TO WOUND ONCE A DAY AS NEEDED   Yes [provider]  traZODone (DESYREL) 50 MG tablet Take 25 mg by mouth at bedtime as needed (for insomnia).   Yes [provider]  UNABLE TO FIND MIGHTY SHAKES: Drink 1 shake by mouth once a day   Yes [provider]   No results found.  All pertinent xrays, MRI, CT independently reviewed and interpreted  Positive ROS: All other systems have been reviewed and were otherwise negative with the exception of those mentioned in the HPI and as above.  Physical Exam: General: no acute distress Cardiovascular: No pedal edema Respiratory: No cyanosis, no use of accessory musculature GI: No organomegaly, abdomen is soft and non-tender Skin: No lesions in the area of chief complaint Neurologic: Sensation intact distally Psychiatric: Patient is demented Lymphatic: No axillary or cervical lymphadenopathy  MUSCULOSKELETAL:  - pain with movement of the hip and extremity - skin intact - NVI distally - compartments soft  Assessment: Left intertroch hip fracture  Plan: - patient has severe dementia is nearly end of life per daughter - we will await cardiac eval to determine risk - family will let me know of their wishes whether to have surgery or not   Thank you for the consult and the opportunity to see [name]  N. Eduard Roux, MD [place] 7:20 AM

## 2017-04-18 NOTE — Progress Notes (Signed)
    I acknowledge the note from Dr. Erlinda Hong and Belenda Cruise LCSW that the patients family has elected for hospice and medical management or surgical management of the fracture. Please call if you need cardiology assistance at a later date.  Delos Haring, PA-C

## 2017-04-18 NOTE — Consult Note (Signed)
Consultation Note Date: 04/18/2017   Patient Name: [name]  DOB: 11-13-1926  MRN: [id number]  Age / Sex: 83 y.o., female  PCP: Patient, No Pcp Per Referring Physician: Louellen Molder, MD  Reason for Consultation: Establishing goals of care  HPI/Patient Profile: 83 y.o. female  with past medical history of Alzheimer's dementia, HTN, CHF, emphysema, A fib, hx abdominal surgeries with current abdominal abscess-  admitted on 04/17/2017 with fall resulting in hip fracture. Palliative medicine consulted for [employer].    Clinical Assessment and Goals of Care: Met with patient's daughter [name].   Patient was residing at [facility] unit prior to admission. She had decreased po intake and was walking very little, only when reminded. Her functional level was minimal. Roberta Rocher has noticed her Mom has been choking when drinking fluids and eating and worries she may be aspirating but does not wish to pursue swallow eval or workup for aspiration pneumonia.   Roberta Rocher has already been in discussion with surgery and patient's primary team and has made the decision to proceed with comfort measures only. I again reviewed the difference between full aggressive care and comfort measures. Furthermore, if patient were to develop subsequent infection or sequelae resulting from this fracture and decreased functional limitations and transition to comfort measures only (which is highly likely), Roberta Rocher understands and chooses to continue the choice of comfort measures only. Her spouse is on the board of [facility] and she tells me she has already been in contact with their residential facility and they have reviewed her mother's Cox and will  have a bed available for her soon.   Primary Decision Maker NEXT OF KIN patient's daughter- [name]    SUMMARY OF RECOMMENDATIONS -No surgical  procedures -Full comfort measures -Referral to social work for residential hospice placement- family has already been in contact with [facility] (husband is on board)    Code Status/Advance Care Planning:  DNR    Symptom Management:   Roxicodone solution 24m/5mL [id number] prn pain or SOB  Palliative Prophylaxis:   Delirium Protocol and Frequent Pain Assessment  Additional Recommendations (Limitations, Scope, Preferences):  Avoid Hospitalization, Full Comfort Care, Minimize Medications and No Surgical Procedures  Psycho-social/Spiritual:   Desire for further Chaplaincy support:no  Additional Recommendations: Education on Hospice  Prognosis:    < 6 weeks due to hip fracture in setting of advance dementia with plan for transition to comfort measures only, patient likely aspirating- no plan for further workup, patient also has abdominal abscess s/p hx abdominal surgeries- family does not wish to treat  Discharge Planning: Hospice facility  Primary Diagnoses: Present on Admission: . [place]  I have reviewed the medical record, interviewed the patient and family, and examined the patient. The following aspects are pertinent.  Past Medical History:  Diagnosis Date  . Allergic rhinitis   . Arthritis   . [place]rial [place] ([place]  . Cataract    NOS  . Dementia   .  Depression with anxiety   . Emphysema   . Fistula of intestine   . GERD (gastroesophageal reflux disease)   . HTN (hypertension)   . Hypokalemia   . IBS (irritable bowel syndrome)   . Macular degeneration   . Malaise and fatigue   . Mass of breast, right   . Mixed incontinence urge and stress (female)(female)   . Pulmonary nodule    benign. pet scan 7/08  . Syncope   . Vitamin B12 deficiency    Social History   Social History  . Marital status: Widowed    Spouse name: N/A  . Number of children: N/A  . Years of education: N/A   Social History  Main Topics  . Smoking status: Former Research scientist (life sciences)  . Smokeless tobacco: Never Used  . Alcohol use No  . Drug use: No  . Sexual activity: Not Asked   Other Topics Concern  . None   Social History Narrative   Widowed; lives with children. Retired.    Family History  Problem Relation Age of Onset  . Lung cancer Unknown        sibiling 74  . Colon cancer Unknown        child at 43 - deceased  . Diabetes Unknown        DM - child    Scheduled Meds: . amiodarone  200 mg Oral Daily  . cholecalciferol  1,000 Units Oral BID  . divalproex  125 mg Oral Q12H  . levothyroxine  50 mcg Oral QAC breakfast  . mirtazapine  15 mg Oral QHS   Continuous Infusions: . famotidine (PEPCID) IV     PRN Meds:.HYDROcodone-acetaminophen, HYDROmorphone (DILAUDID) injection, LORazepam, nitroGLYCERIN, polyethylene glycol Medications Prior to Admission:  Prior to Admission medications   Medication Sig Start Date End Date Taking? Authorizing Provider  acetaminophen (MAPAP) 500 MG tablet Take 500-1,000 mg by mouth See admin instructions. 500 mg every 8 hours AS NEEDED for pain and 1,000 mg every 6 hours AS NEEDED for minor discomfort or headaches (contact MD if headache lasts longer than 24 hrs)   Yes [provider]  acetaminophen (TYLENOL) 325 MG tablet Take 650 mg by mouth 2 (two) times daily. MORNING AND BEDTIME   Yes [provider]  amiodarone (PACERONE) 200 MG tablet Take 200 mg by mouth daily.   Yes [provider]  aspirin 81 MG EC tablet Take 81 mg by mouth daily.     Yes [provider]  carvedilol (COREG) 12.5 MG tablet Take 12.5 mg by mouth 2 (two) times daily.   Yes [provider]  Cholecalciferol (VITAMIN D3) 10000 units TABS Take 1,000 Units by mouth 2 (two) times daily.   Yes [provider]  divalproex (DEPAKOTE SPRINKLE) 125 MG capsule Take 125-250 mg by mouth See admin instructions. 125 mg in the morning and 250 mg at bedtime   Yes [provider]  levothyroxine (SYNTHROID, LEVOTHROID) 50 MCG tablet Take 50 mcg by mouth daily.   Yes [provider]  loperamide (IMODIUM A-D) 2 MG tablet Take 2 mg by mouth See admin instructions. WITH EACH LOOSE STOOL/MAY TAKE UP TO 8 DOSES IN 24 HOURS   Yes [provider]  LORazepam (ATIVAN) 0.5 MG tablet Take 0.25 mg by mouth 2 (two) times daily as needed (for acute agitation, anxiety, and behavioral issue).   Yes [provider]  mirtazapine (REMERON) 15 MG tablet Take 22.5 mg by mouth at bedtime.    Yes  [provider]  nitroGLYCERIN (NITROSTAT) 0.4 MG SL tablet Place 0.4 mg under the tongue every 5 (five) minutes x 3 doses as needed for chest pain. IF NO RELIEF, CALL 9-1-1   Yes [provider]  NONFORMULARY OR COMPOUNDED ITEM Lorazepam Gel 0.5 mg/ml: Apply 1 ml topically to the skin two times a day as needed for agitation or anxiety   Yes [provider]  nystatin (NYSTATIN) powder Apply topically 2 (two) times daily. AFTER CLEANSING MID-ABDOMINAL WOUND/COVER WITH GAUZE DRESSING AFTER APPLYING POWDER   Yes [provider]  nystatin cream (MYCOSTATIN) Apply 1 application topically 2 (two) times daily. APPLY TO GROIN AND ABDOMINAL SKIN FOLDS AND MAY APPLY TO WOUND ONCE A DAY AS NEEDED   Yes [provider]  traZODone (DESYREL) 50 MG tablet Take 25 mg by mouth at bedtime as needed (for insomnia).   Yes [provider]  UNABLE TO FIND MIGHTY SHAKES: Drink 1 shake by mouth once a day   Yes [provider]   Allergies  Allergen Reactions  . Ciprofloxacin Other (See Comments)    Tolerated single ED dose but told in past not to take  . Quinolones     Reaction (?) This is per paperwork from [employer]  . Morphine Rash    REAC   Review of Systems  Unable to perform ROS: Dementia    Physical Exam  Constitutional:  Frail, thin  Abdominal: Soft.  Musculoskeletal: She exhibits tenderness.   Left lower extremity tenderness  Neurological:  confused  Skin: There is pallor.  Psychiatric:  confused  Nursing note and vitals reviewed.   Vital Signs: BP (!) 160/82 (BP Location: Left Arm)   Pulse 84   Temp 98.4 F (36.9 C) (Oral)   Resp 18   Wt 52.2 kg (115 lb)   SpO2 92%   BMI 19.14 kg/m  Pain Assessment: Faces   Pain Score: Asleep   SpO2: SpO2: 92 % O2 Device:SpO2: 92 % O2 Flow Rate: .   IO: Intake/output summary:  Intake/Output Summary (Last 24 hours) at 04/18/17 1142 Last data filed at 04/18/17 0557  Gross per 24 hour  Intake                0 ml  Output              300 ml  Net             -300 ml    LBM: Last BM Date: 04/16/17 Baseline Weight: Weight: 52.2 kg (115 lb) Most recent weight: Weight: 52.2 kg (115 lb)     Palliative Assessment/Data: PPS: 30%     Thank you for this consult. Palliative medicine will continue to follow and assist as needed.    Time Total:70 minutes Greater than 50%  of this time was spent counseling and coordinating care related to the above assessment and plan.  Signed by: Mariana Kaufman, AGNP-C Palliative Medicine    Please contact Palliative Medicine Team phone at [phone] for questions and concerns.  For individual provider: See Shea Evans

## 2017-04-18 NOTE — Discharge Planning (Signed)
Family requesting for patient to NOT have surgery and be discharged to [facility] if possible. Dr. And social work notified of wishes and asked to assess.

## 2017-04-18 NOTE — Progress Notes (Signed)
PROGRESS NOTE                                                                                                                                                                                                             Patient Demographics:    [name], is a 83 y.o. female, DOB - [birth year], [id number]:[id number]  Admit date - 04/17/2017   Admitting Physician Waldemar Dickens, MD  Outpatient Primary MD for the patient is Patient, No Pcp Per  LOS - 1    No chief complaint on file.      Brief Narrative   83 year old female with severe Alzheimer's dementia, hypertension, nonischemic cardiomyopathy, chronic emphysema and A. fib presented with fall at her memory care unit and sustained a left hip fracture. Orthopedic surgery consulted. Family inclined towards hospice given her  underlying comorbidity and severe dementia with poor quality of life.   Subjective:   Patient is extremely confused and unable to provide much history.   Assessment  & Plan :    Principal problem Acute comminuted intertrochanteric fracture of the left femur Secondary to mechanical fall. Orthopedic consult appreciated. Family wish on patient went to residential hospice with comfort measures given her poor quality of life with severe Alzheimer's dementia, advanced age and comorbidities. Family also agreed that she would be a high risk surgery with risk for poor postoperative recovery. Palliative care consult appreciated. Patient is DO NOT RESUSCITATE. Goal is no surgical procedures and endorsed for comfort measures. Social work consulted. [facility] [place]. Patient can be discharged tomorrow.  For now pain control with oxycodone solution every 2 hours when necessary. Add low-dose IV Dilaudid for severe pain. When necessary Haldol for agitation. Added bowel regimen.  Overall prognosis is poor.  Remaining medical  issues are stable. Minimized home medications.   Code Status : DO NOT RESUSCITATE  Family Communication  : Daughter at bedside  Disposition Plan  : Residential hospice tomorrow  Barriers For Discharge : Hospice placement  Consults  :   [place] [facility] Palliative care  Procedures  : None  DVT Prophylaxis  : None (comfort care)  Lab Results  Component Value Date   PLT 296 04/18/2017    Antibiotics  :    Anti-infectives    None        Objective:  Vitals:   04/17/17 2112 04/18/17 0254 04/18/17 0557 04/18/17 1345  BP: (!) 147/77 (!) 152/71 (!) 160/82 124/67  Pulse: 85 78 84 82  Resp: 18 18 18 16   Temp: 99 F (37.2 C) 98.7 F (37.1 C) 98.4 F (36.9 C) 97.7 F (36.5 C)  TempSrc: Oral Oral Oral Axillary  SpO2: 95% 94% 92% 97%  Weight:        Wt Readings from Last 3 Encounters:  04/17/17 52.2 kg (115 lb)  02/06/12 73 kg (161 lb)  04/16/09 69.9 kg (154 lb)     Intake/Output Summary (Last 24 hours) at 04/18/17 1719 Last data filed at 04/18/17 1345  Gross per 24 hour  Intake              360 ml  Output              525 ml  Net             -165 ml     Physical Exam  Gen: Elderly thin built female, lying in bed, restless, very confused HEENT: Moist mucosa, supple neck Chest: Clear bilaterally CVS: Normal S1 and S2, no murmurs GI:, Nondistended, nontender musculoskeletal: Limited mobility of the left hip, no edema CNS: Alert and oriented 0    Data Review:    CBC  Recent Labs Lab 04/18/17 0537  WBC 11.9*  HGB 13.2  HCT 40.6  PLT 296  MCV 92.5  MCH 30.1  MCHC 32.5  RDW 17.1*    Chemistries   Recent Labs Lab 04/18/17 0537  NA 139  K 4.6  CL 102  CO2 25  GLUCOSE 109*  BUN 21*  CREATININE 1.05*  CALCIUM 8.8*   ------------------------------------------------------------------------------------------------------------------ No results for input(s): CHOL, HDL, LDLCALC, TRIG, CHOLHDL, LDLDIRECT in the last 72 hours.  No  results found for: HGBA1C ------------------------------------------------------------------------------------------------------------------ No results for input(s): TSH, T4TOTAL, T3FREE, THYROIDAB in the last 72 hours.  Invalid input(s): FREET3 ------------------------------------------------------------------------------------------------------------------ No results for input(s): VITAMINB12, FOLATE, FERRITIN, TIBC, IRON, RETICCTPCT in the last 72 hours.  Coagulation profile No results for input(s): INR, PROTIME in the last 168 hours.  No results for input(s): DDIMER in the last 72 hours.  Cardiac Enzymes No results for input(s): CKMB, TROPONINI, MYOGLOBIN in the last 168 hours.  Invalid input(s): CK ------------------------------------------------------------------------------------------------------------------ No results found for: BNP  Inpatient Medications  Scheduled Meds: . divalproex  125 mg Oral Q12H  . lidocaine  1 patch Transdermal Q24H  . mirtazapine  15 mg Oral QHS   Continuous Infusions: . famotidine (PEPCID) IV     PRN Meds:.haloperidol **OR** haloperidol, HYDROmorphone (DILAUDID) injection, LORazepam, nitroGLYCERIN, oxyCODONE, polyethylene glycol  Micro Results No results found for this or any previous visit (from the past 240 hour(s)).  Radiology Reports No results found.  Time Spent in minutes  25   Louellen Molder M.D on 04/18/2017 at 5:19 PM  Between 7am to 7pm - Pager - [phone]  After 7pm go to [email or web address] - password [facility]  [facility] -  Office  [phone]

## 2017-04-18 NOTE — Care Management Note (Signed)
Case Management Note  Patient Details  Name: [name] MRN: [id number] Date of Birth: 1927/08/03  Subjective/Objective:                    Action/Plan:  Await palliative care consult and recommendations. Expected Discharge Date:                  Expected Discharge Plan:  [place]  In-House Referral:  Clinical Social Work, Hospice / Palliative Care  Discharge planning Services     Post Acute Care Choice:    Choice offered to:     DME Arranged:    DME Agency:     HH Arranged:    [place] Agency:     Status of Service:  In process, will continue to follow  If discussed at Long Length of Stay Meetings, dates discussed:    Additional Comments:  Marilu Favre, RN 04/18/2017, 10:13 AM

## 2017-04-18 NOTE — Clinical Social Work Note (Signed)
Per RNCM and Palliative RN pt's family is seeking residential hospice placement at this time. Family prefers [facility]. CSW has made the referal at this time. Per [facility] expect bed to open up in next 24-48 hours. [facility] will follow up with CSW.  [place], [place]

## 2017-04-18 NOTE — Progress Notes (Signed)
I confirmed with the daughter Langley Gauss who informed me that they have decided for comfort care and hospice for the patient instead of surgery  N. Eduard Roux, MD [place] 12:14 PM

## 2017-04-18 NOTE — Plan of Care (Addendum)
Patient has AMS and is refusing to leave tele in place.  Dr. Informed requesting possible DC of order.

## 2017-04-18 NOTE — Progress Notes (Signed)
Nutrition Brief Note  Chart reviewed. Pt now transitioning to comfort care.  No further nutrition interventions warranted at this time.  Please re-consult as needed.   Stevana Dufner A. Syana Degraffenreid, RD, LDN, CDE Pager: [phone] After hours Pager: [phone]  

## 2017-04-18 NOTE — Progress Notes (Signed)
SLP Cancellation Note  Patient Details Name: [name] MRN: [id number] DOB: 1927/07/27   Cancelled treatment:       Reason Eval/Treat Not Completed: Patient not medically ready. Per RN and chart pt unlikely to need or be able to participate in SLP assessment. Will sign off.   Herbie [place], [place] CCC-SLP [phone]  Lynann Beaver 04/18/2017, 9:59 AM

## 2017-04-18 NOTE — Progress Notes (Signed)
Palliative Medicine Brief Note  No charge note.  Palliative consult received. Chart reviewed. Attempted to call listed contact- Nadine Case to set appointment for meeting- no answer. Will continue to try and contact family for meeting.   Mariana Kaufman, AGNP-C Palliative Medicine  Please call Palliative Medicine team phone with any questions [phone]. For individual providers please see AMION.

## 2017-04-19 DIAGNOSIS — Z7189 Other specified counseling: Secondary | ICD-10-CM

## 2017-04-19 DIAGNOSIS — S72145D Nondisplaced intertrochanteric fracture of left femur, subsequent encounter for closed fracture with routine healing: Secondary | ICD-10-CM

## 2017-04-19 DIAGNOSIS — E43 Unspecified severe protein-calorie malnutrition: Secondary | ICD-10-CM

## 2017-04-19 MED ORDER — FAMOTIDINE 20 MG PO TABS
20.0000 mg | ORAL_TABLET | Freq: Every day | ORAL | Status: DC
  Filled 2017-04-19: qty 1

## 2017-04-19 MED ORDER — LORAZEPAM 0.5 MG PO TABS: 0.2500 mg | ORAL_TABLET | Freq: Two times a day (BID) | ORAL | 0 refills | Status: AC | PRN

## 2017-04-19 MED ORDER — HYDROMORPHONE HCL 1 MG/ML IJ SOLN: 0.5000 mg | INTRAMUSCULAR | Status: DC | PRN

## 2017-04-19 MED ORDER — OXYCODONE HCL 5 MG/5ML PO SOLN: 5.0000 mg | ORAL | 0 refills | Status: AC | PRN

## 2017-04-19 MED ORDER — HALOPERIDOL 0.5 MG PO TABS: 0.5000 mg | ORAL_TABLET | ORAL | 0 refills | Status: AC | PRN

## 2017-04-19 MED ORDER — POLYETHYLENE GLYCOL 3350 17 G PO PACK: 17.0000 g | PACK | Freq: Every day | ORAL | 0 refills | Status: AC | PRN

## 2017-04-19 NOTE — Plan of Care (Signed)
Patient refuses to change positions or have assistance to.  Patient become agitated and aggressive is she is touched.  After multiple attempts, due to AMS, patient consistent;y insists to NOT be "touched."

## 2017-04-19 NOTE — Clinical Social Work Note (Signed)
Pt will be transferred to [facility] at 10:00 in the AM 8/10. CSW will set up transport. Facility prepared--pt's daughter accepts and prepared.   [place], [place]

## 2017-04-19 NOTE — Plan of Care (Signed)
Staff has attempted several times to reposition/turn patient.  Patient refuses to turn and becomes aggressive and agitated when "touched."

## 2017-04-19 NOTE — Discharge Summary (Addendum)
Physician Discharge Summary  [name] VQX:[id number] DOB: [birth year] DOA: 04/17/2017  PCP: Patient, No Pcp Per  Admit date: 04/17/2017 Discharge date: 04/20/2017  Admitted From: Assisted living, memory care unit Disposition:  Residential hospice  Recommendations for Outpatient Follow-up:  Follow-up at Residential hospice.Goal for comfort care.   Equipment/Devices: None  Discharge Condition: Guarded CODE STATUS: DO NOT RESUSCITATE Diet recommendation: Regular    Discharge Diagnoses:  Principal Problem:   Nondisplaced intertrochanteric fracture of left femur, subsequent encounter for closed fracture with routine healing   Active Problems:   Alzheimer's dementia without behavioral disturbance   Fall at nursing home   Palliative care by specialist   Advance care planning   Goals of care, counseling/discussion   Severe protein-calorie malnutrition ([place])  Brief narrative/history of present illness 83 year old female with severe Alzheimer's dementia, hypertension, nonischemic cardiomyopathy, chronic emphysema and A. fib presented with fall at her memory care unit and sustained a left hip fracture. Orthopedic surgery consulted. Family inclined towards hospice given her  underlying comorbidity and severe dementia with poor quality of life.  Hospital course  Principal problem Acute comminuted intertrochanteric fracture of the left femur Secondary to mechanical fall. Orthopedic consult appreciated. Family wish on no aggressive surgical measures given her advanced age, severe dementia and poor quality of life. Lately for patient going to residential hospice with comfort measures given her poor quality of life with severe Alzheimer's dementia, advanced age and comorbidities. Family also agreed that she would be a high risk surgery with risk for poor postoperative recovery. Palliative care consult appreciated. Patient is DO NOT RESUSCITATE. Goal is no surgical procedures and family wish  for comfort measures.   For now pain control with oxycodone solution every 2 hours when necessary. Add when necessary Haldol for agitation. Resume home dose Ativan as needed for anxiety. Added bowel regimen.  Overall prognosis is poor.  Remaining medical issues are stable. Minimized home medications.     Family Communication  : Daughter at bedside  Disposition Plan  : Residential hospice     Consults  :   [place] [facility] Palliative care  Procedures  : None   Discharge Instructions   Allergies as of 04/19/2017      Reactions   Ciprofloxacin Other (See Comments)   Tolerated single ED dose but told in past not to take   Quinolones    Reaction (?) This is per paperwork from [facility]   Morphine Rash   REAC      Medication List    STOP taking these medications   NONFORMULARY OR COMPOUNDED ITEM   UNABLE TO FIND     TAKE these medications   acetaminophen 325 MG tablet Commonly known as:  TYLENOL Take 650 mg by mouth 2 (two) times daily. MORNING AND BEDTIME What changed:  Another medication with the same name was removed. Continue taking this medication, and follow the directions you see here.   amiodarone 200 MG tablet Commonly known as:  PACERONE Take 200 mg by mouth daily.   aspirin 81 MG EC tablet Take 81 mg by mouth daily.   carvedilol 12.5 MG tablet Commonly known as:  COREG Take 12.5 mg by mouth 2 (two) times daily.   divalproex 125 MG capsule Commonly known as:  DEPAKOTE SPRINKLE Take 125-250 mg by mouth See admin instructions. 125 mg in the morning and 250 mg at bedtime   haloperidol 0.5 MG tablet Commonly known as:  HALDOL Take 1 tablet (0.5 mg total) by mouth every  4 (four) hours as needed for agitation (or delirium).   levothyroxine 50 MCG tablet Commonly known as:  SYNTHROID, LEVOTHROID Take 50 mcg by mouth daily.   loperamide 2 MG tablet Commonly known as:  IMODIUM A-D Take 2 mg by mouth See admin  instructions. WITH EACH LOOSE STOOL/MAY TAKE UP TO 8 DOSES IN 24 HOURS   LORazepam 0.5 MG tablet Commonly known as:  ATIVAN Take 0.5 tablets (0.25 mg total) by mouth 2 (two) times daily as needed (for acute agitation, anxiety, and behavioral issue).   mirtazapine 15 MG tablet Commonly known as:  REMERON Take 22.5 mg by mouth at bedtime.   nitroGLYCERIN 0.4 MG SL tablet Commonly known as:  NITROSTAT Place 0.4 mg under the tongue every 5 (five) minutes x 3 doses as needed for chest pain. IF NO RELIEF, CALL 9-1-1   nystatin powder Generic drug:  nystatin Apply topically 2 (two) times daily. AFTER CLEANSING MID-ABDOMINAL WOUND/COVER WITH GAUZE DRESSING AFTER APPLYING POWDER   nystatin cream Commonly known as:  MYCOSTATIN Apply 1 application topically 2 (two) times daily. APPLY TO GROIN AND ABDOMINAL SKIN FOLDS AND MAY APPLY TO WOUND ONCE A DAY AS NEEDED   oxyCODONE 5 MG/5ML solution Commonly known as:  ROXICODONE Take 5 mLs (5 mg total) by mouth every 2 (two) hours as needed for moderate pain or severe pain (shortness of breath).   polyethylene glycol packet Commonly known as:  MIRALAX / GLYCOLAX Take 17 g by mouth daily as needed for mild constipation.   traZODone 50 MG tablet Commonly known as:  DESYREL Take 25 mg by mouth at bedtime as needed (for insomnia).   Vitamin D3 10000 units Tabs Take 1,000 Units by mouth 2 (two) times daily.       Allergies  Allergen Reactions  . Ciprofloxacin Other (See Comments)    Tolerated single ED dose but told in past not to take  . Quinolones     Reaction (?) This is per paperwork from [employer]  . Morphine Rash    REAC        Procedures/Studies:  No results found.    Subjective: Remains confused. No acute overnight events  Discharge Exam: Vitals:   04/18/17 0557 04/18/17 1345  BP: (!) 160/82 124/67  Pulse: 84 82  Resp: 18 16  Temp: 98.4 F (36.9 C) 97.7 F (36.5 C)  SpO2: 92% 97%   Vitals:    04/17/17 2112 04/18/17 0254 04/18/17 0557 04/18/17 1345  BP: (!) 147/77 (!) 152/71 (!) 160/82 124/67  Pulse: 85 78 84 82  Resp: 18 18 18 16   Temp:  98.7 F (37.1 C) 98.4 F (36.9 C) 97.7 F (36.5 C)  TempSrc: Oral Oral Oral Axillary  SpO2: 95% 94% 92% 97%  Weight:        Gen: Elderly thin built female, lying in bed, restless, very confused HEENT: Moist mucosa, supple neck Chest: Clear bilaterally CVS: Normal S1 and S2, no murmurs GI:, Nondistended, nontender musculoskeletal: Limited mobility of the left hip, no edema CNS: Alert and oriented 0  The results of significant diagnostics from this hospitalization (including imaging, microbiology, ancillary and laboratory) are listed below for reference.     Microbiology: No results found for this or any previous visit (from the past 240 hour(s)).   Labs: BNP (last 3 results) No results for input(s): BNP in the last 8760 hours. Basic Metabolic Panel:  Recent Labs Lab 04/18/17 0537  NA 139  K 4.6  CL 102  CO2 25  GLUCOSE 109*  BUN 21*  CREATININE 1.05*  CALCIUM 8.8*   Liver Function Tests: No results for input(s): AST, ALT, ALKPHOS, BILITOT, PROT, ALBUMIN in the last 168 hours. No results for input(s): LIPASE, AMYLASE in the last 168 hours. No results for input(s): AMMONIA in the last 168 hours. CBC:  Recent Labs Lab 04/18/17 0537  WBC 11.9*  HGB 13.2  HCT 40.6  MCV 92.5  PLT 296   Cardiac Enzymes: No results for input(s): CKTOTAL, CKMB, CKMBINDEX, TROPONINI in the last 168 hours. BNP: Invalid input(s): POCBNP CBG:  Recent Labs Lab 04/18/17 0833  GLUCAP 119*   D-Dimer No results for input(s): DDIMER in the last 72 hours. Hgb A1c No results for input(s): HGBA1C in the last 72 hours. Lipid Profile No results for input(s): CHOL, HDL, LDLCALC, TRIG, CHOLHDL, LDLDIRECT in the last 72 hours. Thyroid function studies No results for input(s): TSH, T4TOTAL, T3FREE, THYROIDAB in the last 72  hours.  Invalid input(s): FREET3 Anemia work up No results for input(s): VITAMINB12, FOLATE, FERRITIN, TIBC, IRON, RETICCTPCT in the last 72 hours. Urinalysis    Component Value Date/Time   COLORURINE YELLOW 04/17/2017 1822   APPEARANCEUR CLOUDY (A) 04/17/2017 1822   LABSPEC 1.014 04/17/2017 1822   PHURINE 7.0 04/17/2017 1822   GLUCOSEU NEGATIVE 04/17/2017 1822   HGBUR MODERATE (A) 04/17/2017 1822   HGBUR negative 11/13/2008 1512   BILIRUBINUR NEGATIVE 04/17/2017 1822   KETONESUR 20 (A) 04/17/2017 1822   PROTEINUR 30 (A) 04/17/2017 1822   UROBILINOGEN 0.2 11/13/2008 1512   NITRITE POSITIVE (A) 04/17/2017 1822   LEUKOCYTESUR LARGE (A) 04/17/2017 1822   Sepsis Labs Invalid input(s): PROCALCITONIN,  WBC,  LACTICIDVEN Microbiology No results found for this or any previous visit (from the past 240 hour(s)).   Time coordinating discharge: Over 30 minutes  SIGNED:   Louellen Molder, MD  [facility] 04/19/2017, 1:33 PM Pager   If 7PM-7AM, please contact night-coverage [email or web address] Password [email or web address]

## 2017-04-20 NOTE — Progress Notes (Signed)
Patient discharged to [facility] via [place], reported to nurse Lenard Lance.

## 2017-04-20 NOTE — Progress Notes (Signed)
PROGRESS NOTE                                                                                                                                                                                                             Patient Demographics:    [name], is a 83 y.o. female, DOB - 23-Feb-1927, KVQ:[id number]  Admit date - 04/17/2017   Admitting Physician Waldemar Dickens, MD  Outpatient Primary MD for the patient is Patient, No Pcp Per  LOS - 3    No chief complaint on file.      Brief Narrative   83 year old female with severe Alzheimer's dementia, hypertension, nonischemic cardiomyopathy, chronic emphysema and A. fib presented with fall at her memory care unit and sustained a left hip fracture. Orthopedic surgery consulted. Family inclined towards hospice given her  underlying comorbidity and severe dementia with poor quality of life.   Subjective:   No overnight events   Assessment  & Plan :     Acute comminuted intertrochanteric fracture of the left femur Goal for comfort. D/c to residential hospice   Procedures  : None   Lab Results  Component Value Date   PLT 296 04/18/2017    Antibiotics  :    Anti-infectives    None        Objective:   Vitals:   04/17/17 2112 04/18/17 0254 04/18/17 0557 04/18/17 1345  BP: (!) 147/77 (!) 152/71 (!) 160/82 124/67  Pulse: 85 78 84 82  Resp: 18 18 18 16   Temp:  98.7 F (37.1 C) 98.4 F (36.9 C) 97.7 F (36.5 C)  TempSrc: Oral Oral Oral Axillary  SpO2: 95% 94% 92% 97%  Weight:        Wt Readings from Last 3 Encounters:  04/17/17 52.2 kg (115 lb)  02/06/12 73 kg (161 lb)  04/16/09 69.9 kg (154 lb)     Intake/Output Summary (Last 24 hours) at 04/20/17 0950 Last data filed at 04/20/17 0400  Gross per 24 hour  Intake               60 ml  Output              650 ml  Net             -590 ml     Physical Exam  Gen:  very confused HEENT: Moist  mucosa, supple neck Chest: Clear bilaterally CVS: Normal S1 and S2, no murmurs musculoskeletal: Limited mobility of the left hip, no edema     Data Review:    CBC  Recent Labs Lab 04/18/17 0537  WBC 11.9*  HGB 13.2  HCT 40.6  PLT 296  MCV 92.5  MCH 30.1  MCHC 32.5  RDW 17.1*    Chemistries   Recent Labs Lab 04/18/17 0537  NA 139  K 4.6  CL 102  CO2 25  GLUCOSE 109*  BUN 21*  CREATININE 1.05*  CALCIUM 8.8*   ------------------------------------------------------------------------------------------------------------------ No results for input(s): CHOL, HDL, LDLCALC, TRIG, CHOLHDL, LDLDIRECT in the last 72 hours.  No results found for: HGBA1C ------------------------------------------------------------------------------------------------------------------ No results for input(s): TSH, T4TOTAL, T3FREE, THYROIDAB in the last 72 hours.  Invalid input(s): FREET3 ------------------------------------------------------------------------------------------------------------------ No results for input(s): VITAMINB12, FOLATE, FERRITIN, TIBC, IRON, RETICCTPCT in the last 72 hours.  Coagulation profile No results for input(s): INR, PROTIME in the last 168 hours.  No results for input(s): DDIMER in the last 72 hours.  Cardiac Enzymes No results for input(s): CKMB, TROPONINI, MYOGLOBIN in the last 168 hours.  Invalid input(s): CK ------------------------------------------------------------------------------------------------------------------ No results found for: BNP  Inpatient Medications  Scheduled Meds: . divalproex  125 mg Oral Q12H  . famotidine  20 mg Oral Daily  . mirtazapine  15 mg Oral QHS   Continuous Infusions:  PRN Meds:.haloperidol **OR** haloperidol, HYDROmorphone (DILAUDID) injection, LORazepam, nitroGLYCERIN, oxyCODONE, polyethylene glycol  Micro Results No results found for this or any previous visit (from the past 240 hour(s)).  Radiology  Reports No results found.  Time Spent in minutes  15   Louellen Molder M.D on 04/20/2017 at 9:50 AM  Between 7am to 7pm - Pager - [phone]  After 7pm go to [email or web address] - password [facility]  [facility] -  Office  [phone]

## 2017-04-20 NOTE — Clinical Social Work Placement (Signed)
   CLINICAL SOCIAL WORK PLACEMENT  NOTE  Date:  04/20/2017  Patient Details  Name: [name] MRN: [id number] Date of Birth: 1927-04-04  Clinical Social Work is seeking post-discharge placement for this patient at the  [facility]) level of care (*CSW will initial, date and re-position this form in  chart as items are completed):      Patient/family provided with [facility] Work Department's list of facilities offering this level of care within the geographic area requested by the patient (or if unable, by the patient's family).  Yes   Patient/family informed of their freedom to choose among providers that offer the needed level of care, that participate in Medicare, Medicaid or managed care program needed by the patient, have an available bed and are willing to accept the patient.      Patient/family informed of [facility]'s ownership interest in [facility] and [facility], as well as of the fact that they are under no obligation to receive care at these facilities.  PASRR submitted to EDS on       PASRR number received on       Existing PASRR number confirmed on       FL2 transmitted to all facilities in geographic area requested by pt/family on       FL2 transmitted to all facilities within larger geographic area on       Patient informed that his/her managed care company has contracts with or will negotiate with certain facilities, including the following:            Patient/family informed of bed offers received.  Patient chooses bed at  [facility])     Physician recommends and patient chooses bed at      Patient to be transferred to  [facility] on 04/20/17.  Patient to be transferred to facility by [facility]     Patient family notified on 04/20/17 of transfer.  Name of family member notified:  Nadine     PHYSICIAN       Additional Comment:    _______________________________________________ Eileen Stanford,  LCSW 04/20/2017, 10:11 AM

## 2017-04-20 NOTE — Clinical Social Work Note (Signed)
Clinical Social Worker facilitated patient discharge including contacting patient family and facility to confirm patient discharge plans.  Clinical information faxed to facility and family agreeable with plan.  CSW arranged ambulance transport via [facility] to [facility].  RN to call [phone] for report prior to discharge.  Clinical Social Worker will sign off for now as social work intervention is no longer needed. Please consult [place] again if new need arises.  [place], [place]

## 2017-05-12 DEATH — deceased
